# Patient Record
Sex: Female | Born: 1965 | Race: Black or African American | Hispanic: No | Marital: Married | Smoking: Never smoker
Health system: Southern US, Community
[De-identification: ages and names within clinical notes are randomized; demographics above are authoritative.]

## PROBLEM LIST (undated history)

## (undated) ENCOUNTER — Emergency Department (HOSPITAL_COMMUNITY): Payer: Self-pay

## (undated) DIAGNOSIS — N809 Endometriosis, unspecified: Secondary | ICD-10-CM

## (undated) DIAGNOSIS — N83209 Unspecified ovarian cyst, unspecified side: Secondary | ICD-10-CM

## (undated) HISTORY — PX: NO PAST SURGERIES: SHX2092

---

## 1998-02-16 ENCOUNTER — Other Ambulatory Visit: Admission: RE | Admit: 1998-02-16 | Discharge: 1998-02-16 | Payer: Self-pay | Admitting: Obstetrics and Gynecology

## 1998-05-22 ENCOUNTER — Encounter: Payer: Self-pay | Admitting: Emergency Medicine

## 1998-05-22 ENCOUNTER — Emergency Department (HOSPITAL_COMMUNITY): Admission: EM | Admit: 1998-05-22 | Discharge: 1998-05-22 | Payer: Self-pay | Admitting: Emergency Medicine

## 1999-01-03 ENCOUNTER — Encounter: Payer: Self-pay | Admitting: Emergency Medicine

## 1999-01-03 ENCOUNTER — Emergency Department (HOSPITAL_COMMUNITY): Admission: EM | Admit: 1999-01-03 | Discharge: 1999-01-03 | Payer: Self-pay | Admitting: Emergency Medicine

## 1999-01-10 ENCOUNTER — Encounter: Admission: RE | Admit: 1999-01-10 | Discharge: 1999-02-14 | Payer: Self-pay

## 2000-11-10 ENCOUNTER — Encounter: Payer: Self-pay | Admitting: Emergency Medicine

## 2000-11-10 ENCOUNTER — Emergency Department (HOSPITAL_COMMUNITY): Admission: EM | Admit: 2000-11-10 | Discharge: 2000-11-10 | Payer: Self-pay | Admitting: Emergency Medicine

## 2001-05-05 ENCOUNTER — Encounter: Payer: Self-pay | Admitting: Emergency Medicine

## 2001-05-05 ENCOUNTER — Inpatient Hospital Stay (HOSPITAL_COMMUNITY): Admission: AC | Admit: 2001-05-05 | Discharge: 2001-05-11 | Payer: Self-pay

## 2001-05-06 ENCOUNTER — Encounter: Payer: Self-pay | Admitting: Surgery

## 2001-05-07 ENCOUNTER — Encounter: Payer: Self-pay | Admitting: General Surgery

## 2001-05-08 ENCOUNTER — Encounter: Payer: Self-pay | Admitting: General Surgery

## 2001-07-14 ENCOUNTER — Encounter: Payer: Self-pay | Admitting: General Surgery

## 2001-07-14 ENCOUNTER — Ambulatory Visit (HOSPITAL_COMMUNITY): Admission: RE | Admit: 2001-07-14 | Discharge: 2001-07-14 | Payer: Self-pay | Admitting: General Surgery

## 2002-01-17 ENCOUNTER — Emergency Department (HOSPITAL_COMMUNITY): Admission: EM | Admit: 2002-01-17 | Discharge: 2002-01-17 | Payer: Self-pay | Admitting: *Deleted

## 2002-04-18 ENCOUNTER — Emergency Department (HOSPITAL_COMMUNITY): Admission: EM | Admit: 2002-04-18 | Discharge: 2002-04-18 | Payer: Self-pay | Admitting: Emergency Medicine

## 2002-04-30 ENCOUNTER — Emergency Department (HOSPITAL_COMMUNITY): Admission: EM | Admit: 2002-04-30 | Discharge: 2002-04-30 | Payer: Self-pay
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
## 2003-04-19 ENCOUNTER — Other Ambulatory Visit: Admission: RE | Admit: 2003-04-19 | Discharge: 2003-04-19 | Payer: Self-pay | Admitting: Family Medicine

## 2004-09-11 ENCOUNTER — Emergency Department (HOSPITAL_COMMUNITY): Admission: EM | Admit: 2004-09-11 | Discharge: 2004-09-11 | Payer: Self-pay | Admitting: Family Medicine

## 2005-08-21 ENCOUNTER — Emergency Department (HOSPITAL_COMMUNITY): Admission: EM | Admit: 2005-08-21 | Discharge: 2005-08-21 | Payer: Self-pay | Admitting: Family Medicine

## 2005-09-24 ENCOUNTER — Other Ambulatory Visit: Admission: RE | Admit: 2005-09-24 | Discharge: 2005-09-24 | Payer: Self-pay | Admitting: Obstetrics and Gynecology

## 2006-10-21 ENCOUNTER — Emergency Department (HOSPITAL_COMMUNITY): Admission: EM | Admit: 2006-10-21 | Discharge: 2006-10-21 | Payer: Self-pay | Admitting: Family Medicine

## 2007-07-06 ENCOUNTER — Emergency Department (HOSPITAL_COMMUNITY): Admission: EM | Admit: 2007-07-06 | Discharge: 2007-07-06 | Payer: Self-pay | Admitting: Emergency Medicine

## 2007-08-04 ENCOUNTER — Emergency Department (HOSPITAL_COMMUNITY): Admission: EM | Admit: 2007-08-04 | Discharge: 2007-08-04 | Payer: Self-pay | Admitting: Emergency Medicine

## 2007-10-16 ENCOUNTER — Emergency Department (HOSPITAL_COMMUNITY): Admission: EM | Admit: 2007-10-16 | Discharge: 2007-10-16 | Payer: Self-pay | Admitting: Family Medicine

## 2007-12-09 ENCOUNTER — Emergency Department (HOSPITAL_COMMUNITY): Admission: EM | Admit: 2007-12-09 | Discharge: 2007-12-09 | Payer: Self-pay | Admitting: Emergency Medicine

## 2010-02-14 ENCOUNTER — Other Ambulatory Visit: Admission: RE | Admit: 2010-02-14 | Discharge: 2010-02-14 | Payer: Self-pay | Admitting: Obstetrics and Gynecology

## 2010-02-14 ENCOUNTER — Ambulatory Visit: Payer: Self-pay | Admitting: Obstetrics and Gynecology

## 2010-03-01 ENCOUNTER — Ambulatory Visit: Payer: Self-pay | Admitting: Obstetrics and Gynecology

## 2010-07-24 ENCOUNTER — Ambulatory Visit: Payer: Self-pay | Admitting: Obstetrics and Gynecology

## 2010-08-02 ENCOUNTER — Emergency Department (HOSPITAL_COMMUNITY): Admission: EM | Admit: 2010-08-02 | Discharge: 2010-04-12 | Payer: Self-pay | Admitting: Emergency Medicine

## 2010-09-04 ENCOUNTER — Ambulatory Visit (HOSPITAL_COMMUNITY): Admission: RE | Admit: 2010-09-04 | Payer: Self-pay | Source: Home / Self Care | Admitting: Obstetrics and Gynecology

## 2010-09-05 ENCOUNTER — Ambulatory Visit: Admit: 2010-09-05 | Payer: Self-pay | Admitting: Obstetrics and Gynecology

## 2010-09-26 ENCOUNTER — Encounter: Payer: Self-pay | Admitting: Obstetrics and Gynecology

## 2010-10-08 ENCOUNTER — Other Ambulatory Visit: Payer: Self-pay | Admitting: Obstetrics and Gynecology

## 2010-10-08 DIAGNOSIS — R1900 Intra-abdominal and pelvic swelling, mass and lump, unspecified site: Secondary | ICD-10-CM

## 2010-10-15 ENCOUNTER — Institutional Professional Consult (permissible substitution): Payer: Self-pay | Admitting: Obstetrics and Gynecology

## 2010-10-16 ENCOUNTER — Ambulatory Visit (HOSPITAL_COMMUNITY)
Admission: RE | Admit: 2010-10-16 | Discharge: 2010-10-16 | Disposition: A | Discharge status: Home / Self Care | Payer: BC Managed Care – PPO | Source: Ambulatory Visit | Attending: Obstetrics and Gynecology | Admitting: Obstetrics and Gynecology

## 2010-10-16 ENCOUNTER — Encounter (HOSPITAL_COMMUNITY)
Admission: RE | Admit: 2010-10-16 | Discharge: 2010-10-16 | Disposition: A | Discharge status: Home / Self Care | Payer: BC Managed Care – PPO | Source: Ambulatory Visit | Attending: Obstetrics and Gynecology | Admitting: Obstetrics and Gynecology

## 2010-10-16 DIAGNOSIS — D252 Subserosal leiomyoma of uterus: Secondary | ICD-10-CM | POA: Insufficient documentation

## 2010-10-16 DIAGNOSIS — N9489 Other specified conditions associated with female genital organs and menstrual cycle: Secondary | ICD-10-CM | POA: Insufficient documentation

## 2010-10-16 DIAGNOSIS — R1900 Intra-abdominal and pelvic swelling, mass and lump, unspecified site: Secondary | ICD-10-CM

## 2010-10-16 LAB — CBC
HCT: 40.3 % (ref 36.0–46.0)
Hemoglobin: 13.4 g/dL (ref 12.0–15.0)
MCV: 90 fL (ref 78.0–100.0)
WBC: 5.6 10*3/uL (ref 4.0–10.5)

## 2010-10-17 ENCOUNTER — Institutional Professional Consult (permissible substitution) (INDEPENDENT_AMBULATORY_CARE_PROVIDER_SITE_OTHER): Payer: BC Managed Care – PPO | Admitting: Obstetrics and Gynecology

## 2010-10-17 DIAGNOSIS — N949 Unspecified condition associated with female genital organs and menstrual cycle: Secondary | ICD-10-CM

## 2010-10-23 ENCOUNTER — Ambulatory Visit (HOSPITAL_COMMUNITY)
Admission: RE | Admit: 2010-10-23 | Payer: BC Managed Care – PPO | Source: Ambulatory Visit | Admitting: Obstetrics and Gynecology

## 2010-12-18 ENCOUNTER — Institutional Professional Consult (permissible substitution): Payer: BC Managed Care – PPO | Admitting: Obstetrics and Gynecology

## 2010-12-24 ENCOUNTER — Institutional Professional Consult (permissible substitution) (INDEPENDENT_AMBULATORY_CARE_PROVIDER_SITE_OTHER): Payer: BC Managed Care – PPO | Admitting: Obstetrics and Gynecology

## 2010-12-24 DIAGNOSIS — N63 Unspecified lump in unspecified breast: Secondary | ICD-10-CM

## 2010-12-24 DIAGNOSIS — E7800 Pure hypercholesterolemia, unspecified: Secondary | ICD-10-CM
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
## 2011-05-17 LAB — POCT URINALYSIS DIP (DEVICE): Nitrite: NEGATIVE

## 2011-05-29 ENCOUNTER — Other Ambulatory Visit: Payer: Self-pay | Admitting: *Deleted

## 2011-05-29 ENCOUNTER — Other Ambulatory Visit: Payer: Self-pay | Admitting: Obstetrics and Gynecology

## 2011-05-29 DIAGNOSIS — N63 Unspecified lump in unspecified breast: Secondary | ICD-10-CM

## 2011-06-24 ENCOUNTER — Telehealth: Payer: Self-pay | Admitting: *Deleted

## 2011-06-24 NOTE — Telephone Encounter (Signed)
Patient wants [place] to ask you two things... 1. Would you be willing to see her 45 year old niece for gyn care (New patient) 2.  Could you recommend a good pediatrician

## 2011-06-24 NOTE — Telephone Encounter (Signed)
Tell patient Sorry but I do not see new patient's. Given her Dr. Maryellen Pile  name as a pediatrician.

## 2011-06-24 NOTE — Telephone Encounter (Signed)
Patient informed the information below.  Patient requested an appointment.  Transferred to appointments.

## 2011-11-18 ENCOUNTER — Other Ambulatory Visit: Payer: Self-pay

## 2011-11-18 DIAGNOSIS — N6019 Diffuse cystic mastopathy of unspecified breast: Secondary | ICD-10-CM

## 2012-06-26 ENCOUNTER — Emergency Department (INDEPENDENT_AMBULATORY_CARE_PROVIDER_SITE_OTHER)
Admission: EM | Admit: 2012-06-26 | Discharge: 2012-06-26 | Disposition: A | Discharge status: Home / Self Care | Payer: Self-pay | Source: Home / Self Care

## 2012-06-26 ENCOUNTER — Encounter (HOSPITAL_COMMUNITY): Payer: Self-pay | Admitting: Emergency Medicine

## 2012-06-26 DIAGNOSIS — A084 Viral intestinal infection, unspecified: Secondary | ICD-10-CM

## 2012-06-26 DIAGNOSIS — H8309 Labyrinthitis, unspecified ear: Secondary | ICD-10-CM

## 2012-06-26 DIAGNOSIS — J029 Acute pharyngitis, unspecified: Secondary | ICD-10-CM

## 2012-06-26 DIAGNOSIS — B9789 Other viral agents as the cause of diseases classified elsewhere: Secondary | ICD-10-CM

## 2012-06-26 DIAGNOSIS — B349 Viral infection, unspecified: Secondary | ICD-10-CM

## 2012-06-26 DIAGNOSIS — A088 Other specified intestinal infections: Secondary | ICD-10-CM

## 2012-06-26 DIAGNOSIS — H68003 Unspecified Eustachian salpingitis, bilateral: Secondary | ICD-10-CM

## 2012-06-26 DIAGNOSIS — H68009 Unspecified Eustachian salpingitis, unspecified ear: Secondary | ICD-10-CM

## 2012-06-26 MED ORDER — AMOXICILLIN 500 MG PO CAPS: 500.0000 mg | ORAL_CAPSULE | Freq: Three times a day (TID) | ORAL | Status: DC

## 2012-06-26 MED ORDER — HYDROXYZINE HCL 50 MG PO TABS: ORAL_TABLET | ORAL | Status: DC

## 2012-06-26 NOTE — ED Notes (Signed)
Pt c/o cold sx x5 days... Sx include: right ear pain, sore throat, fever, vomiting, nausea, diarrhea, cough w/yellow sputum, abd pain... Taking OTC cold meds... Pt is alert w/no signs of distress.

## 2012-06-26 NOTE — ED Provider Notes (Signed)
Medical screening examination/treatment/procedure(s) were performed by non-physician practitioner and as supervising physician I was immediately available for consultation/collaboration.  Diamonique Ruedas   Aldred Mase, MD 06/26/12 1403 

## 2012-06-26 NOTE — ED Provider Notes (Signed)
History     CSN: [id number]  Arrival date & time 06/26/12  1104   None     Chief Complaint  Patient presents with  . URI    (Consider location/radiation/quality/duration/timing/severity/associated sxs/prior treatment) HPI Comments: 46 year old female, who presents with "flulike symptoms". She complains of sore throat, earaches, abdominal cramping, and weakness. Yesterday, last night. She vomited a total of 4 times. No vomiting. Today. Yesterday. She had trace diarrhea, but none today. She denies myalgias or urinary symptoms.   History reviewed. No pertinent past medical history.  History reviewed. No pertinent past surgical history.  No family history on file.  History  Substance Use Topics  . Smoking status: Never Smoker   . Smokeless tobacco: Not on file  . Alcohol Use: Yes    OB History    Grav Para Term Preterm Abortions TAB SAB Ect Mult Living                  Review of Systems  Constitutional: Negative for fever, chills, activity change, appetite change and fatigue.  HENT: Positive for congestion, sore throat, rhinorrhea and postnasal drip. Negative for facial swelling, neck pain and neck stiffness.   Eyes: Negative.   Respiratory: Positive for cough.   Cardiovascular: Negative.   Gastrointestinal: Positive for nausea, vomiting, abdominal pain and diarrhea. Negative for constipation.  Genitourinary: Negative.   Skin: Negative for pallor and rash.  Neurological: Negative.     Allergies  Pear  Home Medications   Current Outpatient Rx  Name Route Sig Dispense Refill  . AMOXICILLIN 500 MG PO CAPS Oral Take 1 capsule (500 mg total) by mouth 3 (three) times daily. 21 capsule 0  . AMOXICILLIN 500 MG PO CAPS Oral Take 1 capsule (500 mg total) by mouth 3 (three) times daily. 21 capsule 0  . HYDROXYZINE HCL 50 MG PO TABS  One or two q hs prn sleeping 6 tablet 0    BP 148/85  Pulse 60  Temp 98.4 F (36.9 C) (Oral)  Resp 18  SpO2 100%  Physical Exam    Nursing note and vitals reviewed. Constitutional: She is oriented to person, place, and time. She appears well-developed and well-nourished. No distress.  HENT:  Head: Normocephalic.  Right Ear: External ear normal.  Left Ear: External ear normal.  Mouth/Throat: No oropharyngeal exudate.       OP with minor erythema, but no swelling or exudates.  Eyes: Conjunctivae normal and EOM are normal. Pupils are equal, round, and reactive to light.  Neck: Normal range of motion. Neck supple.  Cardiovascular: Normal rate, regular rhythm and normal heart sounds.   Pulmonary/Chest: Effort normal and breath sounds normal. No respiratory distress. She has no wheezes.  Abdominal: Soft. She exhibits no distension and no mass. There is no rebound and no guarding.       Minor tenderness to the left lower quadrant.  Musculoskeletal: Normal range of motion.  Lymphadenopathy:    She has no cervical adenopathy.  Neurological: She is alert and oriented to person, place, and time. No cranial nerve deficit.  Skin: Skin is warm and dry.  Psychiatric: She has a normal mood and affect.    ED Course  Procedures (including critical care time)   Labs Reviewed  POCT RAPID STREP A ([facility] ONLY)   No results found.   1. Viral syndrome   2. Viral labyrinthitis syndrome   3. Pharyngitis   4. Viral gastroenteritis   5. Salpingitis of both Eustachian tubes  MDM   Results for orders placed during the hospital encounter of 06/26/12  POCT RAPID STREP A ([facility] ONLY)      Component Value Range   Streptococcus, Group A Screen (Direct) NEGATIVE  NEGATIVE   I explained to the patient that she has a viral type syndrome and did not find any evidence of a bacterial infection. First strep throat swab was negative. Explained to her about eustachian tube dysfunction and ear pain, as well as the reason for her sore throat. She, states she has been taking NyQuil and OTC medications, but is not sure of  her symptoms. I tried to explain to her about viruses or symptoms and running a course. She seemed to either refused to understand or unable to understand. She insists on having an antibiotic and and becoming mildly argumentative. I prescribed amoxicillin 3 times a day for 7 days . She wanted something to help her sleep. I gave her 6 capsules of hydroxyzine 50 mg to take each bedtime when necessary Drink clear any of fluids and stay well hydrated I strongly recommended that she read the instructions on antibiotic use for viruses.        Hayden Rasmussen, NP 06/26/12 1402

## 2012-10-14 ENCOUNTER — Encounter (HOSPITAL_COMMUNITY): Payer: Self-pay | Admitting: *Deleted

## 2012-10-14 ENCOUNTER — Emergency Department (HOSPITAL_COMMUNITY): Payer: Self-pay

## 2012-10-14 ENCOUNTER — Emergency Department (HOSPITAL_COMMUNITY)
Admission: EM | Admit: 2012-10-14 | Discharge: 2012-10-14 | Disposition: A | Discharge status: Home / Self Care | Payer: Self-pay | Attending: Emergency Medicine | Admitting: Emergency Medicine

## 2012-10-14 DIAGNOSIS — J029 Acute pharyngitis, unspecified: Secondary | ICD-10-CM | POA: Insufficient documentation

## 2012-10-14 DIAGNOSIS — J40 Bronchitis, not specified as acute or chronic: Secondary | ICD-10-CM | POA: Insufficient documentation

## 2012-10-14 DIAGNOSIS — J069 Acute upper respiratory infection, unspecified: Secondary | ICD-10-CM | POA: Insufficient documentation

## 2012-10-14 LAB — RAPID STREP SCREEN (MED CTR MEBANE ONLY): Streptococcus, Group A Screen (Direct): NEGATIVE

## 2012-10-14 MED ORDER — PROMETHAZINE-DM 6.25-15 MG/5ML PO SYRP: 5.0000 mL | ORAL_SOLUTION | Freq: Four times a day (QID) | ORAL | Status: DC | PRN

## 2012-10-14 MED ORDER — PSEUDOEPHEDRINE HCL 60 MG PO TABS: ORAL_TABLET | ORAL | Status: DC

## 2012-10-14 MED ORDER — PREDNISONE 10 MG PO TABS: 20.0000 mg | ORAL_TABLET | Freq: Every day | ORAL | Status: DC

## 2012-10-14 NOTE — ED Provider Notes (Signed)
History  This chart was scribed for non-physician practitioner working with Flint Melter, MD by Ardeen Jourdain, ED Scribe. This patient was seen in room TR06C/TR06C and the patient's care was started at 1950.  CSN: [id number]  Arrival date & time 10/14/12  1746   None     Chief Complaint  Patient presents with  . Cough  . Sore Throat     Patient is a 47 y.o. female presenting with cough and pharyngitis. The history is provided by the patient. No language interpreter was used.  Cough Cough characteristics:  Productive Sputum characteristics:  Yellow Severity:  Moderate Onset quality:  Gradual Duration:  8 weeks Timing:  Intermittent Progression:  Unchanged Chronicity:  New Smoker: no   Context: sick contacts   Relieved by:  Nothing Worsened by:  Deep breathing Ineffective treatments:  Decongestant, fluids and cough suppressants Associated symptoms: sore throat   Associated symptoms: no chest pain, no chills, no diaphoresis, no fever, no headaches, no rhinorrhea, no shortness of breath and no sinus congestion   Sore throat:    Severity:  Moderate   Onset quality:  Gradual   Timing:  Constant   Progression:  Unchanged Sore Throat This is a new problem. The current episode started more than 1 week ago. The problem occurs constantly. The problem has not changed since onset.Pertinent negatives include no chest pain, no abdominal pain, no headaches and no shortness of breath. The symptoms are aggravated by swallowing. Nothing relieves the symptoms. She has tried rest for the symptoms. The treatment provided no relief.    Brenda Barton is a 47 y.o. female who presents to the Emergency Department complaining of productive cough with associated sore throat. She states she was seen at Urgent Care in November and was diagnosed with influenza. She states her symptoms have continued since then. She admits to sick contact. She states she had diarrhea a few weeks ago but it resolved  itself. She denies any fever, nausea or emesis currently.   History reviewed. No pertinent past medical history.  History reviewed. No pertinent past surgical history.  History reviewed. No pertinent family history.  History  Substance Use Topics  . Smoking status: Never Smoker   . Smokeless tobacco: Not on file  . Alcohol Use: Yes   No OB history available.   Review of Systems  Constitutional: Negative for fever, chills and diaphoresis.  HENT: Positive for sore throat. Negative for rhinorrhea.   Eyes: Positive for pain.  Respiratory: Positive for cough. Negative for shortness of breath.   Cardiovascular: Negative for chest pain.  Gastrointestinal: Negative for nausea, vomiting, abdominal pain and diarrhea.  Neurological: Negative for headaches.  All other systems reviewed and are negative.    Allergies  Pear  Home Medications   Current Outpatient Rx  Name  Route  Sig  Dispense  Refill  . Phenyleph-CPM-DM-APAP (ALKA-SELTZER PLUS COLD & FLU PO)   Oral   Take 2 tablets by mouth every 6 (six) hours as needed (for cold symptoms).         . Pseudoeph-Doxylamine-DM-APAP (NYQUIL PO)   Oral   Take 2 capsules by mouth every 4 (four) hours as needed (for cold/flu symptoms).           Triage Vitals: BP 142/92  Pulse 67  Temp(Src) 98.6 F (37 C) (Oral)  Resp 18  SpO2 98%  Physical Exam  Nursing note and vitals reviewed. Constitutional: She is oriented to person, place, and time. She appears well-developed  and well-nourished. No distress.  HENT:  Head: Normocephalic and atraumatic.  Right Ear: External ear normal.  Left Ear: External ear normal.  Nose: Nose normal.  Mouth/Throat: Oropharynx is clear and moist. No oropharyngeal exudate.  TMs normal bilaterally, mild swelling of the uvula, nasal congestion present,   Eyes: Conjunctivae and EOM are normal. Pupils are equal, round, and reactive to light.  Neck: Normal range of motion. Neck supple. No tracheal  deviation present.  Cardiovascular: Normal rate, regular rhythm and normal heart sounds.  Exam reveals no gallop and no friction rub.   No murmur heard. Pulmonary/Chest: Effort normal. No respiratory distress. She has no wheezes. She has no rales. She exhibits no tenderness.  Coarse breath sounds  Abdominal: Soft. She exhibits no distension.  Musculoskeletal: Normal range of motion. She exhibits no edema and no tenderness.  Neurological: She is alert and oriented to person, place, and time.  Skin: Skin is warm and dry. She is not diaphoretic.  Psychiatric: She has a normal mood and affect. Her behavior is normal.    ED Course  Procedures (including critical care time)  DIAGNOSTIC STUDIES: Oxygen Saturation is 98% on room air, normal by my interpretation.    COORDINATION OF CARE:  8:24 PM: Discussed treatment plan which includes rapid strep screen and a CXR with pt at bedside and pt agreed to plan.   Results for orders placed during the hospital encounter of 10/14/12  RAPID STREP SCREEN      Result Value Range   Streptococcus, Group A Screen (Direct) NEGATIVE  NEGATIVE   Dg Chest 2 View  10/14/2012  *RADIOLOGY REPORT*  Clinical Data: Shortness of breath.  CHEST - 2 VIEW  Comparison: 04/12/2010.  Findings: The cardiac silhouette, mediastinal and hilar contours are normal and stable.  The lungs are clear of infiltrates, edema or effusions.  There is mild peribronchial thickening and slight increased interstitial markings which could suggest bronchitis. The bony thorax is intact.  IMPRESSION: Bronchitic changes but no focal infiltrates.   Original Report Authenticated By: Rudie Meyer, M.D.       No diagnosis found.    MDM  I have reviewed nursing notes, vital signs, and all appropriate lab and imaging results for this patient.  Patient reports cold symptoms for nearly 3 months. She has been trying over-the-counter medications without success. The patient states she has been  exposed to viral illnesses 2 children in the family, as well as patients at her job. The patient presents to the emergency department today because of soreness in her chest and feeling at times as though it's difficult to breathe. The chest x-ray shows bronchitic changes but no pneumonia. The pulse oximetry is 98% on room air. The patient is able to speak in complete sentences without problem. Plan the patient will be treated with prednisone for 6 days, Sudafed 3 times daily for congestion, patient advised to increase fluids, and to use promethazine DM cough medication for cough and congestion. Patient is to see her primary physician or return to the emergency department if not improving.      Kathie Dike, [place] 10/14/12 2035

## 2012-10-14 NOTE — ED Notes (Signed)
Pt reports having cold and URI symptoms x 2 months. Having sore throat, productive cough with yellow sputum. Denies any fevers or n/v/d. Airway intact.

## 2012-10-14 NOTE — Discharge Instructions (Signed)
Bronchitis Bronchitis is a problem of the air tubes leading to your lungs. This problem makes it hard for air to get in and out of the lungs. You may cough a lot because your air tubes are narrow. Going without care can cause lasting (chronic) bronchitis. HOME CARE   Drink enough fluids to keep your pee (urine) clear or pale yellow.  Use a cool mist humidifier.  Quit smoking if you smoke. If you keep smoking, the bronchitis might not get better.  Only take medicine as told by your doctor. GET HELP RIGHT AWAY IF:   Coughing keeps you awake.  You start to wheeze.  You become more sick or weak.  You have a hard time breathing or get short of breath.  You cough up blood.  Coughing lasts more than 2 weeks.  You have a fever.  Your baby is older than 3 months with a rectal temperature of 102 F (38.9 C) or higher.  Your baby is 62 months old or younger with a rectal temperature of 100.4 F (38 C) or higher. MAKE SURE YOU:  Understand these instructions.  Will watch your condition.  Will get help right away if you are not doing well or get worse. Document Released: 01/29/2008 Document Revised: 11/04/2011 Document Reviewed: 07/14/2009 [facility] Patient Information 2013 [place], [place].  Upper Respiratory Infection, Adult An upper respiratory infection (URI) is also known as the common cold. It is often caused by a type of germ (virus). Colds are easily spread (contagious). You can pass it to others by kissing, coughing, sneezing, or drinking out of the same glass. Usually, you get better in 1 or 2 weeks.  HOME CARE   Only take medicine as told by your doctor.  Use a warm mist humidifier or breathe in steam from a hot shower.  Drink enough water and fluids to keep your pee (urine) clear or pale yellow.  Get plenty of rest.  Return to work when your temperature is back to normal or as told by your doctor. You may use a face mask and wash your hands to stop your cold from  spreading. GET HELP RIGHT AWAY IF:   After the first few days, you feel you are getting worse.  You have questions about your medicine.  You have chills, shortness of breath, or brown or red spit (mucus).  You have yellow or brown snot (nasal discharge) or pain in the face, especially when you bend forward.  You have a fever, puffy (swollen) neck, pain when you swallow, or white spots in the back of your throat.  You have a bad headache, ear pain, sinus pain, or chest pain.  You have a high-pitched whistling sound when you breathe in and out (wheezing).  You have a lasting cough or cough up blood.  You have sore muscles or a stiff neck. MAKE SURE YOU:   Understand these instructions.  Will watch your condition.  Will get help right away if you are not doing well or get worse. Document Released: 01/29/2008 Document Revised: 11/04/2011 Document Reviewed: 12/17/2010 [facility] Patient Information 2013 [place], [place].

## 2012-10-15 NOTE — ED Provider Notes (Signed)
Medical screening examination/treatment/procedure(s) were performed by non-physician practitioner and as supervising physician I was immediately available for consultation/collaboration.   Amila Callies L Amayrany Cafaro, MD 10/15/12 0024 

## 2013-01-21 ENCOUNTER — Inpatient Hospital Stay (HOSPITAL_COMMUNITY)
Admission: AD | Admit: 2013-01-21 | Discharge: 2013-01-22 | Disposition: A | Discharge status: Home / Self Care | Payer: Self-pay | Source: Ambulatory Visit | Attending: Gynecology | Admitting: Gynecology

## 2013-01-21 DIAGNOSIS — R1032 Left lower quadrant pain: Secondary | ICD-10-CM | POA: Insufficient documentation

## 2013-01-21 DIAGNOSIS — N912 Amenorrhea, unspecified: Secondary | ICD-10-CM | POA: Insufficient documentation

## 2013-01-21 DIAGNOSIS — D259 Leiomyoma of uterus, unspecified: Secondary | ICD-10-CM | POA: Insufficient documentation

## 2013-01-21 DIAGNOSIS — K5900 Constipation, unspecified: Secondary | ICD-10-CM

## 2013-01-21 HISTORY — DX: Endometriosis, unspecified: N80.9

## 2013-01-21 HISTORY — DX: Unspecified ovarian cyst, unspecified side: N83.209

## 2013-01-21 NOTE — MAU Note (Signed)
Pt reports LLQ pain x 3 days.  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

## 2013-01-22 ENCOUNTER — Encounter (HOSPITAL_COMMUNITY): Payer: Self-pay | Admitting: *Deleted

## 2013-01-22 ENCOUNTER — Inpatient Hospital Stay (HOSPITAL_COMMUNITY): Payer: Self-pay

## 2013-01-22 DIAGNOSIS — D259 Leiomyoma of uterus, unspecified: Secondary | ICD-10-CM

## 2013-01-22 LAB — WET PREP, GENITAL
Trich, Wet Prep: NONE SEEN
WBC, Wet Prep HPF POC: NONE SEEN

## 2013-01-22 LAB — URINALYSIS, ROUTINE W REFLEX MICROSCOPIC
Glucose, UA: NEGATIVE mg/dL
Ketones, ur: NEGATIVE mg/dL
Leukocytes, UA: NEGATIVE
Protein, ur: NEGATIVE mg/dL
Specific Gravity, Urine: >1.03 — ABNORMAL HIGH (ref 1.005–1.030)
Urobilinogen, UA: 0.2 mg/dL (ref 0.0–1.0)

## 2013-01-22 LAB — POCT PREGNANCY, URINE: Preg Test, Ur: NEGATIVE

## 2013-01-22 LAB — URINE MICROSCOPIC-ADD ON

## 2013-01-22 MED ORDER — HYDROCODONE-ACETAMINOPHEN 7.5-300 MG PO TABS: 1.0000 | ORAL_TABLET | Freq: Four times a day (QID) | ORAL | Status: DC | PRN

## 2013-01-22 MED ORDER — POLYETHYLENE GLYCOL 3350 17 G PO PACK: 17.0000 g | PACK | Freq: Every day | ORAL | Status: DC

## 2013-01-22 MED ORDER — ACETAMINOPHEN 325 MG PO TABS
650.0000 mg | ORAL_TABLET | Freq: Once | ORAL | Status: AC
  Administered 2013-01-22: 650 mg via ORAL
  Filled 2013-01-22: qty 2

## 2013-01-22 NOTE — MAU Provider Note (Signed)
History     CSN: [id number]  Arrival date and time: 01/21/13 2057   None     Chief Complaint  Patient presents with  . Abdominal Pain   HPI This is a 47 y.o. female who presents with c/o LLQ pain for 3 days with "swelling".  Does admit to having some constipation.  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 States ovulated last week. Dr Oletha Blend has seen her in the past, but she has no insurance and has not seen him in years. States will go back to him when she gets Obamacare.  States has to take vicodin at times for pain in this area.   Several times during visit, she had questions about and ruminated about not being able to have kids. States she wants to harvest her eggs and use a surrogate. States "no one in [place] will do it, but there is one doctor in [place] who will".  Wants to know if it will work.   RN Note: Pt reports LLQ pain x 3 days. Pt does not have periods due to irregular developed uterus  OB History   Grav Para Term Preterm Abortions TAB SAB Ect Mult Living                  Past Medical History  Diagnosis Date  . Endometriosis   . Ovarian cyst     Past Surgical History  Procedure Laterality Date  . No past surgeries      No family history on file.  History  Substance Use Topics  . Smoking status: Never Smoker   . Smokeless tobacco: Not on file  . Alcohol Use: Yes     Comment: occasional    Allergies:  Allergies  Allergen Reactions  . Pear Other (See Comments)    Causes skin to peel.    Prescriptions prior to admission  Medication Sig Dispense Refill  . Acetaminophen-Caff-Pyrilamine (MIDOL COMPLETE) 500-60-15 MG TABS Take 3 tablets by mouth 3 (three) times daily as needed.      . naproxen sodium (ALEVE) 220 MG tablet Take 880 mg by mouth 3 (three) times daily as needed (For pain.).      Marland Kitchen Pseudoeph-Doxylamine-DM-APAP (NYQUIL PO) Take 30 mLs by mouth at bedtime as needed (For sleep.).        Review of Systems  Constitutional:  Negative for fever, chills and malaise/fatigue.  Gastrointestinal: Positive for abdominal pain and constipation. Negative for nausea, vomiting and diarrhea.  Genitourinary: Negative for dysuria.  Musculoskeletal: Negative for myalgias.  Neurological: Negative for dizziness, weakness and headaches.   Physical Exam   Blood pressure 128/81, pulse 62, temperature 98.3 F (36.8 C), temperature source Oral, resp. rate 16, height 5\' 4"  (1.626 m), weight 73.664 kg (162 lb 6.4 oz).  Physical Exam  Constitutional: She is oriented to person, place, and time. She appears well-developed and well-nourished. No distress.  HENT:  Head: Normocephalic.  Cardiovascular: Normal rate.   Respiratory: Effort normal.  GI: Soft. She exhibits no distension. There is tenderness (mild over LLQ). There is no rebound and no guarding.  Genitourinary: Vagina normal. No vaginal discharge found.  Cervix absent, Uterus difficult to feel. Fullness is felt in LLQ  Musculoskeletal: Normal range of motion.  Neurological: She is alert and oriented to person, place, and time.  Skin: Skin is warm and dry.  Psychiatric: She has a normal mood and affect.    MAU Course  Procedures  MDM Prior US from 2012  was reviewed. Large LLQ fibroid noted then, along with one other  US Transvaginal Non-ob  01/22/2013   *RADIOLOGY REPORT*  Clinical Data: Left lower quadrant abdominal pain.  Amenorrhea.  TRANSABDOMINAL AND TRANSVAGINAL ULTRASOUND OF PELVIS Technique:  Both transabdominal and transvaginal ultrasound examinations of the pelvis were performed. Transabdominal technique was performed for global imaging of the pelvis including uterus, ovaries, adnexal regions, and pelvic cul-de-sac.  It was necessary to proceed with endovaginal exam following the transabdominal exam to visualize the uterus and ovaries in greater detail.  Comparison:  None  Findings:  Uterus: Normal in size; measures 6.7 x 4.7 x 4.8 cm.  Three fibroids are seen.  The  first is noted obscuring the endometrial canal, at the level of the fundus, measuring 4.1 x 3.5 x 3.4 cm. There is a large exophytic fibroid measuring 8.5 x 7.0 x 6.8 cm arising from the left side of the uterine fundus, and a 3.0 x 2.5 cm fibroid is seen arising at the anterior aspect of the uterine fundus.  Endometrium: Not characterized.  Right ovary:  Normal appearance/no adnexal mass; measures 3.4 x 3.0 x 1.9 cm.  Left ovary: Normal appearance/no adnexal mass; measures 2.5 x 2.2 x 1.9 cm.  Other findings: A small amount of free fluid within the pelvic cul- de-sac is likely physiologic in nature.  IMPRESSION: Fibroid uterus noted, with the largest fibroid measuring 8.5 cm in size.  The endometrial echo complex is not characterized due to an underlying fibroid.  No additional abnormalities seen.  The ovaries appear grossly unremarkable, without evidence for ovarian torsion.   Original Report Authenticated By: Tonia Ghent, M.D.    Assessment and Plan  A:  LLQ pain for  3 days      Patient admits this may be related to constipation      Known stable large Left fibroid      Two other fibroids       Simple cyst right ovary  P;  Reassured fibroids are stable       Pain may be more related to constipation       Rx Miralax and small amt of VIcodin       Offered followup in clinic but pt wants to go back to [place] someday           [facility] 01/22/2013, 1:10 AM

## 2013-01-22 NOTE — MAU Provider Note (Signed)
Chart reviewed and agree with management and plan.  

## 2014-01-16 ENCOUNTER — Encounter (HOSPITAL_COMMUNITY): Payer: Self-pay | Admitting: Emergency Medicine

## 2014-01-16 ENCOUNTER — Emergency Department (HOSPITAL_COMMUNITY)
Admission: EM | Admit: 2014-01-16 | Discharge: 2014-01-16 | Disposition: A | Discharge status: Home / Self Care | Payer: Self-pay | Attending: Emergency Medicine | Admitting: Emergency Medicine

## 2014-01-16 ENCOUNTER — Emergency Department (HOSPITAL_COMMUNITY): Payer: Self-pay

## 2014-01-16 DIAGNOSIS — R071 Chest pain on breathing: Secondary | ICD-10-CM | POA: Insufficient documentation

## 2014-01-16 DIAGNOSIS — R0602 Shortness of breath: Secondary | ICD-10-CM | POA: Insufficient documentation

## 2014-01-16 DIAGNOSIS — Z8742 Personal history of other diseases of the female genital tract: Secondary | ICD-10-CM | POA: Insufficient documentation

## 2014-01-16 DIAGNOSIS — R0789 Other chest pain: Secondary | ICD-10-CM

## 2014-01-16 LAB — BASIC METABOLIC PANEL
BUN: 15 mg/dL (ref 6–23)
CO2: 26 meq/L (ref 19–32)
Calcium: 9.5 mg/dL (ref 8.4–10.5)
Chloride: 104 mEq/L (ref 96–112)
Creatinine, Ser: 0.58 mg/dL (ref 0.50–1.10)
GFR calc Af Amer: >90 mL/min (ref >90)
GLUCOSE: 84 mg/dL (ref 70–99)
POTASSIUM: 4.1 meq/L (ref 3.7–5.3)
SODIUM: 141 meq/L (ref 137–147)

## 2014-01-16 LAB — CBC
HEMATOCRIT: 39.8 % (ref 36.0–46.0)
Hemoglobin: 13.3 g/dL (ref 12.0–15.0)
MCH: 30.2 pg (ref 26.0–34.0)
MCHC: 33.4 g/dL (ref 30.0–36.0)
MCV: 90.2 fL (ref 78.0–100.0)
Platelets: 190 10*3/uL (ref 150–400)
RBC: 4.41 MIL/uL (ref 3.87–5.11)
RDW: 14.6 % (ref 11.5–15.5)
WBC: 7.8 10*3/uL (ref 4.0–10.5)

## 2014-01-16 LAB — I-STAT TROPONIN, ED
TROPONIN I, POC: 0 ng/mL (ref 0.00–0.08)
TROPONIN I, POC: 0 ng/mL (ref 0.00–0.08)

## 2014-01-16 MED ORDER — IBUPROFEN 800 MG PO TABS
800.0000 mg | ORAL_TABLET | Freq: Once | ORAL | Status: AC
  Administered 2014-01-16: 800 mg via ORAL
  Filled 2014-01-16: qty 1

## 2014-01-16 MED ORDER — IBUPROFEN 800 MG PO TABS: 800.0000 mg | ORAL_TABLET | Freq: Three times a day (TID) | ORAL | Status: DC | PRN

## 2014-01-16 NOTE — Discharge Instructions (Signed)

## 2014-01-16 NOTE — ED Provider Notes (Signed)
CSN: [id number]     Arrival date & time 01/16/14  1949 History   First MD Initiated Contact with Patient 01/16/14 1957     Chief Complaint  Patient presents with  . Chest Pain     (Consider location/radiation/quality/duration/timing/severity/associated sxs/prior Treatment) Patient is a 48 y.o. female presenting with chest pain. The history is provided by the patient. No language interpreter was used.  Chest Pain Pain location:  L chest Pain radiates to the back: no   Associated symptoms: shortness of breath   Associated symptoms: no abdominal pain, no cough, no fever, no headache, no nausea and not vomiting   Associated symptoms comment:  Sharp left sided chest pain that started just prior to arrival. She describes sharp shooting pain that lasts seconds and comes and goes. It lasted about 1 1/2 hours before resolving with aspirin. It causes her to be SOB "like its going to hurt more if I breathe." No cough, fever, or recent illness. No nausea or vomiting.    Past Medical History  Diagnosis Date  . Endometriosis   . Ovarian cyst    Past Surgical History  Procedure Laterality Date  . No past surgeries     No family history on file. History  Substance Use Topics  . Smoking status: Never Smoker   . Smokeless tobacco: Not on file  . Alcohol Use: Yes     Comment: occasional   OB History   Grav Para Term Preterm Abortions TAB SAB Ect Mult Living                 Review of Systems  Constitutional: Negative for fever.  Respiratory: Positive for shortness of breath. Negative for cough.   Cardiovascular: Positive for chest pain.  Gastrointestinal: Negative for nausea, vomiting and abdominal pain.  Musculoskeletal: Negative.   Neurological: Negative.  Negative for headaches.      Allergies  Pear  Home Medications   Prior to Admission medications   Not on File   BP 142/84  Pulse 65  Temp(Src) 98 F (36.7 C) (Oral)  Resp 18  Ht 5\' 5"  (1.651 m)  Wt 165 lb (74.844 kg)   BMI 27.46 kg/m2  SpO2 99% Physical Exam  Constitutional: She is oriented to person, place, and time. She appears well-developed and well-nourished.  HENT:  Head: Normocephalic.  Neck: Normal range of motion. Neck supple.  Cardiovascular: Normal rate and regular rhythm.   Pulmonary/Chest: Effort normal and breath sounds normal. She exhibits no tenderness.  Left breast is non-tender without mass. No nipple bleeding or discharge.   Abdominal: Soft. Bowel sounds are normal. There is no tenderness. There is no rebound and no guarding.  Musculoskeletal: Normal range of motion. She exhibits no edema.  Neurological: She is alert and oriented to person, place, and time. No cranial nerve deficit.  Skin: Skin is warm and dry. No rash noted.  Psychiatric: She has a normal mood and affect.    ED Course  Procedures (including critical care time) Labs Review Labs Reviewed  CBC  BASIC METABOLIC PANEL  I-STAT [place], ED  [facility], ED    Imaging Review Dg Chest Port 1 View  01/16/2014   CLINICAL DATA:  Left chest pain.  Short of breath.  EXAM: PORTABLE CHEST - 1 VIEW  COMPARISON:  10/14/2012  FINDINGS: The heart size and mediastinal contours are within normal limits. Both lungs are clear. The visualized skeletal structures are unremarkable.  IMPRESSION: No active disease.   Electronically Signed  By: Lajean Manes M.D.   On: 01/16/2014 20:27     EKG Interpretation None      MDM   Final diagnoses:  None    1. Nonspecific chest wall pain  Troponin, including delta troponin, negative. Repeat EKG in ED negative for acute changes. Symptoms atypical of cardiac chest pain. Doubt ACS. Stable for discharge.     Dewaine Oats, PA-C 01/16/14 2251

## 2014-01-16 NOTE — ED Provider Notes (Signed)
Medical screening examination/treatment/procedure(s) were performed by non-physician practitioner and as supervising physician I was immediately available for consultation/collaboration.   EKG Interpretation None       Date: 01/16/2014  Rate: 66  Rhythm: normal sinus rhythm  QRS Axis: normal  Intervals: normal  ST/T Wave abnormalities: normal  Conduction Disutrbances:none  Narrative Interpretation:   Old EKG Reviewed: unchanged   Date: 01/16/2014  Rate: 70  Rhythm: normal sinus rhythm  QRS Axis: normal  Intervals: normal  ST/T Wave abnormalities: normal  Conduction Disutrbances:none  Narrative Interpretation:   Old EKG Reviewed: unchanged      Malvin Johns, MD 01/16/14 2342

## 2014-01-16 NOTE — ED Notes (Signed)
Pt states she was at work two hours prior and started having a stabbing pain around her left nipple.  Pt took 2 81mg  ASA prior to arrival

## 2014-07-14 IMAGING — US US PELVIS COMPLETE
1 series · 13 of 25 positions shown · non-contrast
Comparison: None

CLINICAL DATA: Left lower quadrant abdominal pain.  Amenorrhea.

TRANSABDOMINAL AND TRANSVAGINAL ULTRASOUND OF PELVIS
TECHNIQUE: Both transabdominal and transvaginal ultrasound
examinations of the pelvis were performed. Transabdominal technique
was performed for global imaging of the pelvis including uterus,
ovaries, adnexal regions, and pelvic cul-de-sac.
It was necessary to proceed with endovaginal exam following the
transabdominal exam to visualize the uterus and ovaries in greater
detail.

[Series 1: us pelvis complete · 13 of 56 slices shown]
[im 1/56]
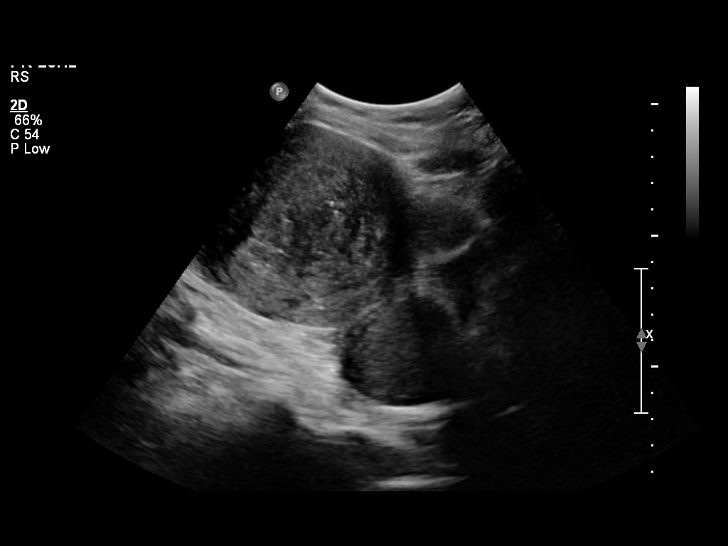
[im 5/56]
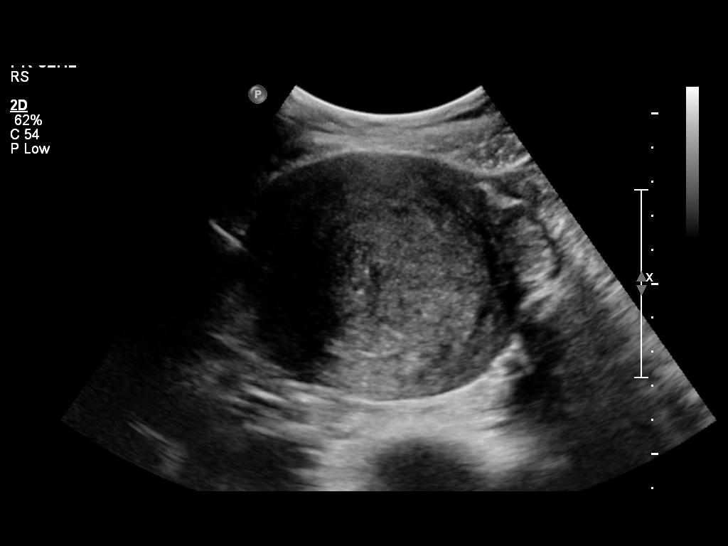
[im 10/56]
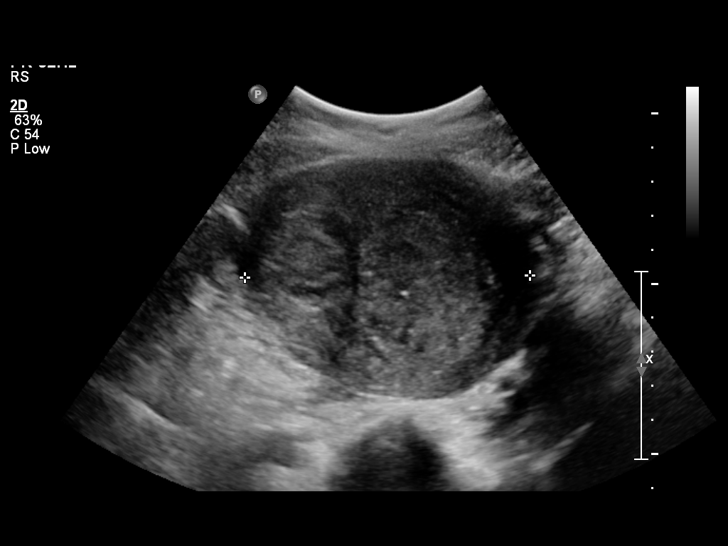
[im 14/56]
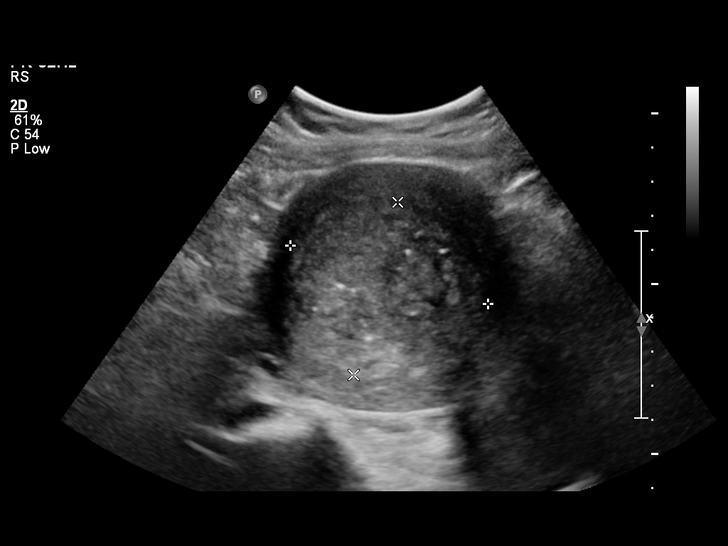
[im 19/56]
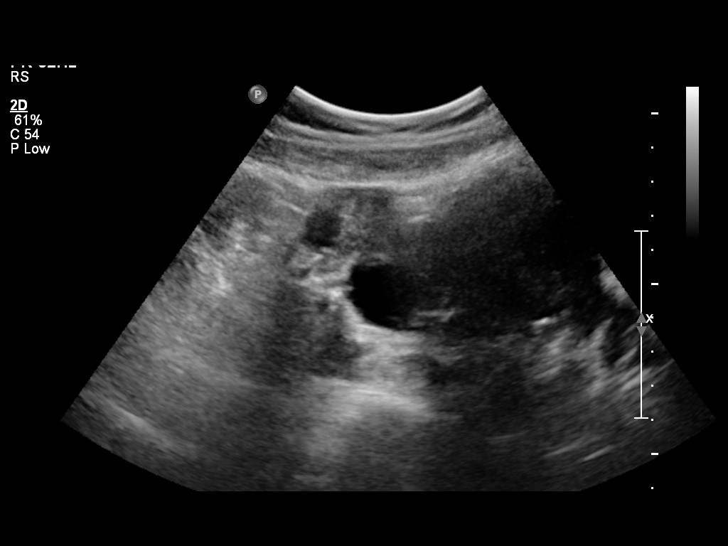
[im 23/56]
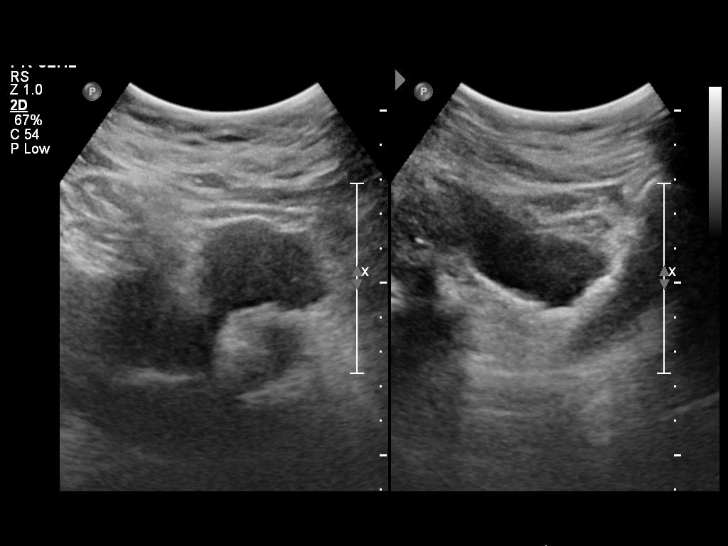
[im 28/56]
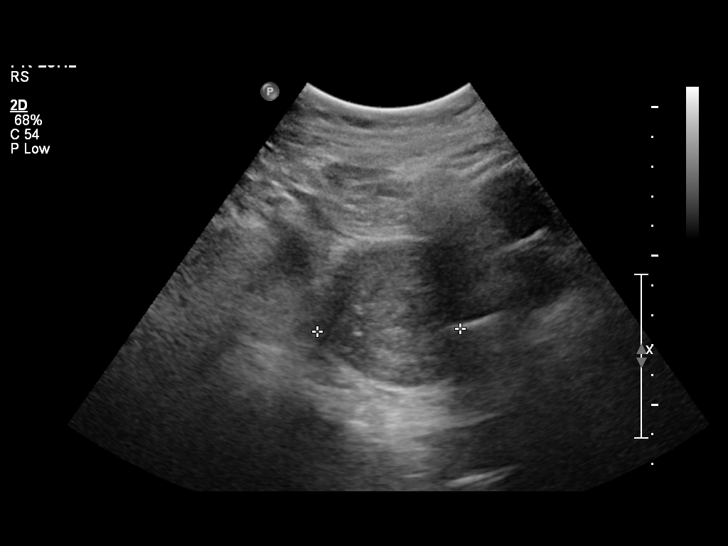
[im 33/56]
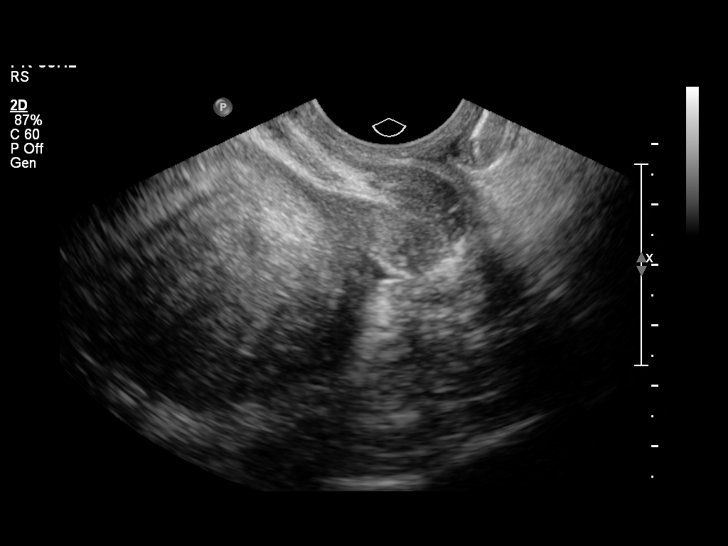
[im 37/56]
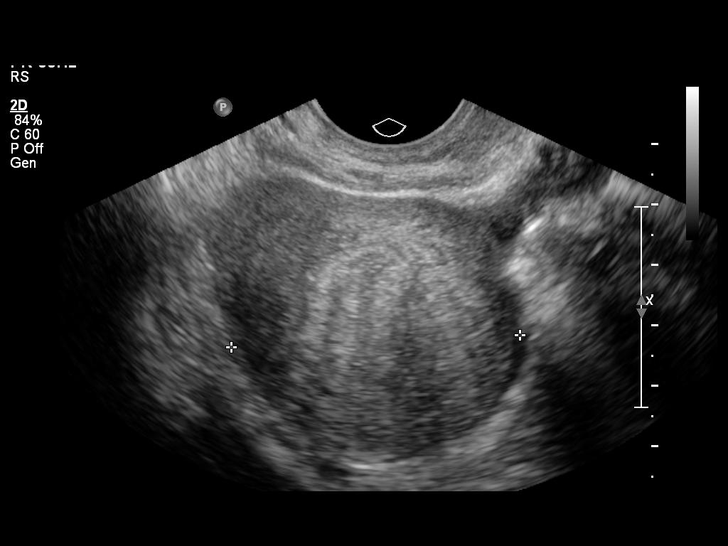
[im 42/56]
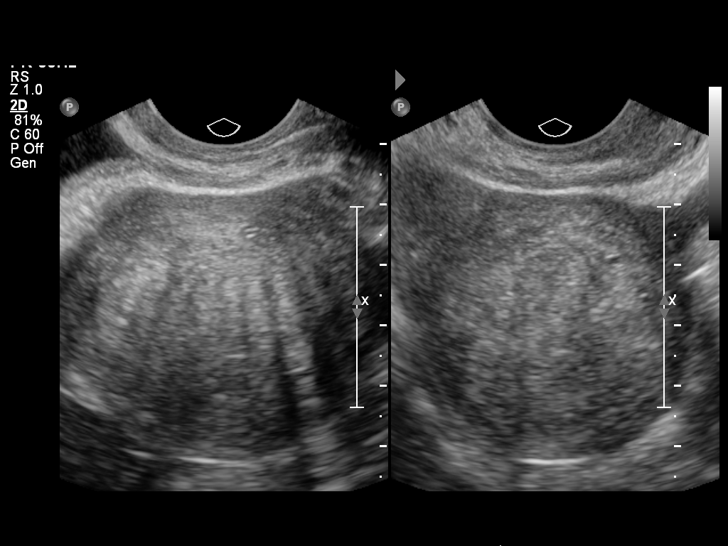
[im 46/56]
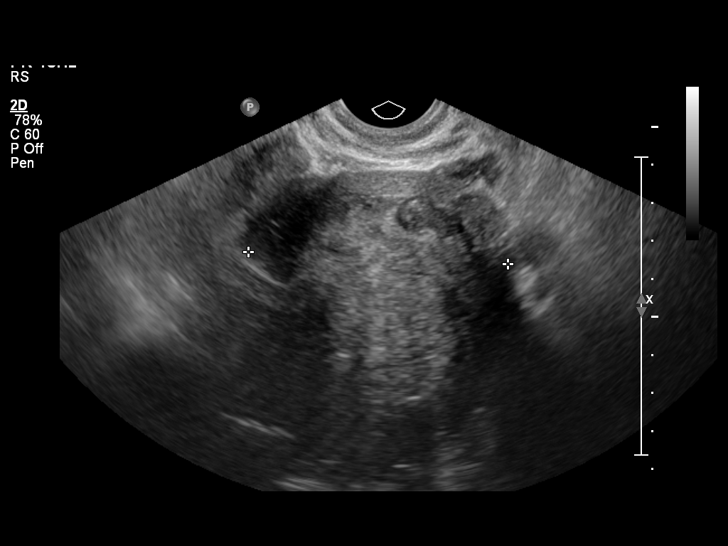
[im 51/56]
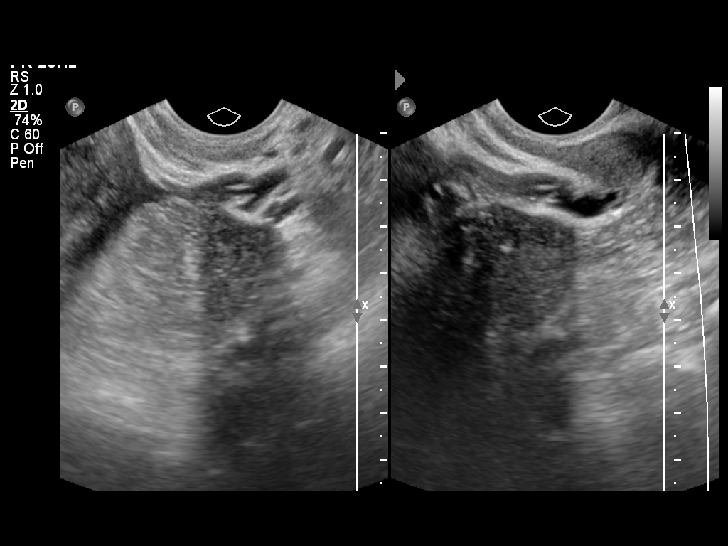
[im 56/56]
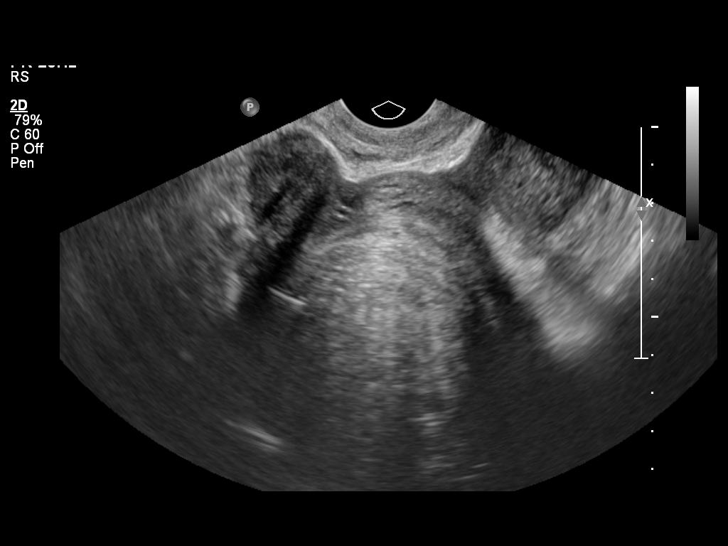

[13 of 25 positions shown; findings below may reference images not displayed]

FINDINGS: Uterus: Normal in size; measures 6.7 x 4.7 x 4.8 cm.  Three
fibroids are seen.  The first is noted obscuring the endometrial
canal, at the level of the fundus, measuring 4.1 x 3.5 x 3.4 cm.
There is a large exophytic fibroid measuring 8.5 x 7.0 x 6.8 cm
arising from the left side of the uterine fundus, and a 3.0 x
cm fibroid is seen arising at the anterior aspect of the uterine
fundus.

Endometrium: Not characterized.

Right ovary:  Normal appearance/no adnexal mass; measures 3.4 x
x 1.9 cm.

Left ovary: Normal appearance/no adnexal mass; measures 2.5 x 2.2 x
1.9 cm.

Other findings: A small amount of free fluid within the pelvic cul-
de-sac is likely physiologic in nature.
IMPRESSION: Fibroid uterus noted, with the largest fibroid measuring 8.5 cm in
size.  The endometrial echo complex is not characterized due to an
underlying fibroid.  No additional abnormalities seen.  The ovaries
appear grossly unremarkable, without evidence for ovarian torsion.

## 2014-11-22 ENCOUNTER — Emergency Department (HOSPITAL_COMMUNITY): Payer: Self-pay

## 2014-11-22 ENCOUNTER — Encounter (HOSPITAL_COMMUNITY): Payer: Self-pay | Admitting: Emergency Medicine

## 2014-11-22 ENCOUNTER — Emergency Department (HOSPITAL_COMMUNITY)
Admission: EM | Admit: 2014-11-22 | Discharge: 2014-11-22 | Disposition: A | Discharge status: Home / Self Care | Payer: Self-pay | Attending: Emergency Medicine | Admitting: Emergency Medicine

## 2014-11-22 DIAGNOSIS — S4992XA Unspecified injury of left shoulder and upper arm, initial encounter: Secondary | ICD-10-CM | POA: Insufficient documentation

## 2014-11-22 DIAGNOSIS — Y998 Other external cause status: Secondary | ICD-10-CM | POA: Insufficient documentation

## 2014-11-22 DIAGNOSIS — Z3202 Encounter for pregnancy test, result negative: Secondary | ICD-10-CM | POA: Insufficient documentation

## 2014-11-22 DIAGNOSIS — S299XXA Unspecified injury of thorax, initial encounter: Secondary | ICD-10-CM | POA: Insufficient documentation

## 2014-11-22 DIAGNOSIS — R109 Unspecified abdominal pain: Secondary | ICD-10-CM

## 2014-11-22 DIAGNOSIS — S24109A Unspecified injury at unspecified level of thoracic spinal cord, initial encounter: Secondary | ICD-10-CM | POA: Insufficient documentation

## 2014-11-22 DIAGNOSIS — Z8742 Personal history of other diseases of the female genital tract: Secondary | ICD-10-CM | POA: Insufficient documentation

## 2014-11-22 DIAGNOSIS — S3991XA Unspecified injury of abdomen, initial encounter: Secondary | ICD-10-CM | POA: Insufficient documentation

## 2014-11-22 DIAGNOSIS — M549 Dorsalgia, unspecified: Secondary | ICD-10-CM

## 2014-11-22 DIAGNOSIS — Y9289 Other specified places as the place of occurrence of the external cause: Secondary | ICD-10-CM | POA: Insufficient documentation

## 2014-11-22 DIAGNOSIS — Y9389 Activity, other specified: Secondary | ICD-10-CM | POA: Insufficient documentation

## 2014-11-22 LAB — I-STAT CHEM 8, ED
BUN: 16 mg/dL (ref 6–23)
CHLORIDE: 106 mmol/L (ref 96–112)
CREATININE: 0.6 mg/dL (ref 0.50–1.10)
Calcium, Ion: 1.2 mmol/L (ref 1.12–1.23)
Glucose, Bld: 86 mg/dL (ref 70–99)
HCT: 45 % (ref 36.0–46.0)
HEMOGLOBIN: 15.3 g/dL — AB (ref 12.0–15.0)
POTASSIUM: 4.3 mmol/L (ref 3.5–5.1)
SODIUM: 142 mmol/L (ref 135–145)
TCO2: 24 mmol/L (ref 0–100)

## 2014-11-22 LAB — I-STAT BETA HCG BLOOD, ED (MC, WL, AP ONLY)

## 2014-11-22 MED ORDER — LORAZEPAM 1 MG PO TABS
1.0000 mg | ORAL_TABLET | Freq: Once | ORAL | Status: AC
  Administered 2014-11-22: 1 mg via ORAL
  Filled 2014-11-22: qty 1

## 2014-11-22 MED ORDER — IOHEXOL 300 MG/ML  SOLN
80.0000 mL | Freq: Once | INTRAMUSCULAR | Status: AC | PRN
  Administered 2014-11-22: 80 mL via INTRAVENOUS

## 2014-11-22 MED ORDER — HYDROXYZINE HCL 10 MG PO TABS
10.0000 mg | ORAL_TABLET | Freq: Once | ORAL | Status: AC
  Administered 2014-11-22: 10 mg via ORAL
  Filled 2014-11-22 (×2): qty 1

## 2014-11-22 MED ORDER — KETOROLAC TROMETHAMINE 60 MG/2ML IM SOLN
60.0000 mg | Freq: Once | INTRAMUSCULAR | Status: AC
  Administered 2014-11-22: 60 mg via INTRAMUSCULAR
  Filled 2014-11-22: qty 2

## 2014-11-22 NOTE — ED Notes (Signed)
SANE Nurse will see pt. In approx 45 min.

## 2014-11-22 NOTE — ED Notes (Signed)
Patient transported to X-ray 

## 2014-11-22 NOTE — Discharge Instructions (Signed)
Return to the emergency room with worsening of symptoms, new symptoms or with symptoms that are concerning , especially fevers, abdominal pain in one area, unable to keep down fluids, blood in stool or vomit, severe pain, you feel faint, lightheaded or pass out. RICE: Rest, Ice (three cycles of 20 mins on, 89mins off at least twice a day), compression/brace, elevation. Heating pad works well for back pain. Ibuprofen 400mg  (2 tablets 200mg ) every 5-6 hours for 3-5 days. Follow up with family services. Please call your doctor for a followup appointment within 24-48 hours. When you talk to your doctor please let them know that you were seen in the emergency department and have them acquire all of your records so that they can discuss the findings with you and formulate a treatment plan to fully care for your new and ongoing problems. If you do not have a primary care provider please call the number below under ED resources to establish care with a provider and follow up.   Emergency Department Resource Guide 1) Find a Doctor and Pay Out of Pocket Although you won't have to find out who is covered by your insurance plan, it is a good idea to ask around and get recommendations. You will then need to call the office and see if the doctor you have chosen will accept you as a new patient and what types of options they offer for patients who are self-pay. Some doctors offer discounts or will set up payment plans for their patients who do not have insurance, but you will need to ask so you aren't surprised when you get to your appointment.  2) Contact Your Local Health Department Not all health departments have doctors that can see patients for sick visits, but many do, so it is worth a call to see if yours does. If you don't know where your local health department is, you can check in your phone book. The CDC also has a tool to help you locate your state's health department, and many state websites also have  listings of all of their local health departments.  3) Find a [facility] If your illness is not likely to be very severe or complicated, you may want to try a walk in clinic. These are popping up all over the country in pharmacies, drugstores, and shopping centers. They're usually staffed by nurse practitioners or physician assistants that have been trained to treat common illnesses and complaints. They're usually fairly quick and inexpensive. However, if you have serious medical issues or chronic medical problems, these are probably not your best option.  No Primary Care Doctor: - Call Health Connect at  [phone] - they can help you locate a primary care doctor that  accepts your insurance, provides certain services, etc. - Physician Referral Service- [phone]  Chronic Pain Problems: Organization         Address  Phone   Notes  [facility]  [phone] Patients need to be referred by their primary care doctor.   Medication Assistance: Organization         Address  Phone   Notes  [facility] [address] [place], [place] [address] [phone] --Must be a resident of [facility] -- Must have NO insurance coverage whatsoever (no Medicaid/ Medicare, etc.) -- The pt. MUST have a primary care doctor that directs their care regularly and follows them in the community   MedAssist  [phone]  [employer]  [phone]    Agencies that provide inexpensive medical care: Organization         Address  Phone   Notes  [place]  [phone]   [facility]    [phone]   [facility] [place], [place] [address] [phone]   [place] [address] [place] [phone]   Planned Parenthood    [phone]   [facility]    [phone]   [place] and [place]  [address], [place] Phone:  [phone], Fax:  [phone] Hours of Operation:  9 am - 6 pm, M-F.  Also accepts Medicaid/Medicare and self-pay.  [facility] for [address], [address], [place] Phone: [phone], Fax: [phone]. Hours of Operation:  8:30 am - 5:30 pm, M-F.  Also accepts Medicaid and self-pay.  [facility] [address], [place] Phone: [phone]   [place], [place], [place] [phone], Ext. [phone] Mondays & Thursdays: 7-9 AM.  First 15 patients are seen on a first come, first serve basis.    [place] Providers:  Organization         Address  Phone   Notes  [facility] [address], [place] [phone] Also accepts self-pay patients.  [facility], [place] [address], [place]  [phone]   [place], [address], [place] [phone]   [facility] [address] [place] [phone]   Lucianne Lei [address], [place]   [phone] Only accepts [place] Access [place] patients after they have their name applied to their card.   Self-Pay (no insurance) in [facility]:  Organization         Address  Phone   Notes  Sickle Cell Patients, [facility] [place] [phone]   [facility] [place] [phone]   [facility] [place]  [place], [place], [place] [phone]   [facility]/Dr. Osei-Bonsu  [address] [place] or [address], [place] [phone] Phone number for both [place] and [place] locations is the same.  Urgent Medical and [facility] [address] [place] [phone]   [facility] [address],  [place] or [address] [phone]   [facility] [address], [place] [phone], phone; [phone], fax Sees patients 1st and 3rd Saturday of every month.  Must not qualify for public or private insurance (i.e. Medicaid, Medicare, [place] Health Choice, Veterans' Benefits)  Household income should be no more than 200% of the poverty level The clinic cannot treat you if you are pregnant or think you are pregnant  Sexually transmitted diseases are not treated at the clinic.    Dental Care: Organization         Address  Phone  Notes  [facility] Department of [facility] [phone] Accepts children up to age 46 who are enrolled in [place] or [place]; pregnant women with a Medicaid card;  and children who have applied for Medicaid or [place] Health Choice, but were declined, whose parents can pay a reduced fee at time of service.  [facility] of [facility]  [address], [place] [phone] Accepts children up to age 30 who are enrolled in [place] or [place]; pregnant women with a Medicaid card; and children who have applied for Medicaid or [place] Health Choice, but were declined, whose parents can pay a reduced fee at time of service.  [place] Adult Dental Access PROGRAM  [place] [phone] Patients are seen by appointment only. Walk-ins are not accepted. [place] will see patients 38 years of age and older. Monday - Tuesday (8am-5pm) Most Wednesdays (8:30-5pm) $30 per visit, cash only  [facility] Adult Dental Access PROGRAM  [address], [facility] [phone] Patients are seen by appointment only. Walk-ins are not accepted. [place] will see patients 52 years of age and older. One Wednesday Evening (Monthly: Volunteer Based).  $30 per visit, cash only  [place]  [phone] for adults; Children under age 65, call Graduate Pediatric Dentistry at [phone]. Children aged 61-14, please call [phone] to request a pediatric application.  Dental services are provided in all areas of dental care including fillings, crowns and bridges, complete and partial dentures, implants, gum treatment, root canals, and extractions. Preventive care is also provided. Treatment is provided to both adults and children. Patients are selected via a lottery and there is often a waiting list.   [facility] [address] [place]  [phone] [email or web address]   [facility] [address] [place], [place] [phone], Ext. [phone] Second and Fourth Thursday of each month, opens at 6:30 AM; Clinic ends at 9 AM.  Patients are seen on a first-come first-served basis, and a limited number are seen during each clinic.   [facility]  [address] Hillard Danker [place], [place] [phone]   Eligibility Requirements You must have lived in [place], [place], or [place] counties for at least the last three months.   You cannot be eligible for state or federal sponsored [employer], including [employer], [place], or [employer].   You generally cannot be eligible for healthcare insurance through your employer.    How to apply: Eligibility screenings are held every Tuesday and Wednesday afternoon from 1:00 pm until 4:00 pm. You do not need an appointment for the interview!  [facility] [address] [place], [place]   [place]  [place] Department  [place]  [phone]    Behavioral Health Resources in the Community: Intensive Outpatient Programs Organization         Address  Phone  Notes  [place]. [address] [place], [place] [phone]     [facility] [address] [place], [place]   ADS: Alcohol & Drug Svcs [address] [place], [place]   [place] [address],  [place], [place] or [phone]   Substance Abuse Resources Organization         Address  Phone  Notes  Alcohol and Drug Services  [place]  [phone]   [facility] [place]   [facility]  [phone]  Residential & Outpatient Substance Abuse Program  [phone]   Psychological Services Organization         Address  Phone  Notes  [facility] [place]  [place]  [phone]   [place] [phone] N[address], [place] or [phone]    Mobile Crisis Teams Organization         Address  Phone  Notes  [facility], Mobile Crisis Care Unit  [phone]   [facility]ive Psychotherapeutic Services  [address] [place], [place]   [place] [address], [place] [phone]    Self-Help/Support Groups Organization         Address  Phone             Notes  [place] of [place] - variety of support groups  [place] Call for more information  Narcotics Anonymous (NA), [facility] [address], [employer] [place]  2 meetings at this location   Special educational needs teacher         Address  Phone  Notes  [facility]idential Treatment [place],    [place]  [phone]   [facility]  [address] [place] [phone], [place], [place]   [place], [place] [phone] Admissions: 8am-3pm M-F  Incentives Substance [place] [address] [place] [address]    [employer], [place] [phone]   [facility] [address] [place], [place], [place]   [facility] [address]  [place],  [place]   [facility] Programs - Intensive Outpatient [address], [place], [place]   [facility] ([place].) [place].,  [place], [place] [phone] or [phone]   [facility] (RTS) [address], [place], [place] Accepts Medicaid  [facility] [address],  [place] [phone] Substance Abuse/Addiction Treatment   [facility] Organization         Address  Phone  Notes  [facility]  [phone]   Domenic Schwab, PhD [address] [place], [place]   [phone] or [phone]   [place], [place] [phone]   [facility] [address], [place], [place] [phone] Insurance/Medicaid/sponsorship through [facility] and Families [address] [place]                                    [place], [place] [phone] [place] [address], [place] [phone]    Dr. Adele Schilder  [phone]   [facility] Dept. 1) [address] [place] 2) [place] 3)  [place] [address], [place] [phone]  [phone]   [place] [phone] or [phone] (After Hours)
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
## 2015-08-11 ENCOUNTER — Encounter (HOSPITAL_COMMUNITY): Payer: Self-pay | Admitting: Emergency Medicine

## 2015-08-11 ENCOUNTER — Emergency Department (HOSPITAL_COMMUNITY)
Admission: EM | Admit: 2015-08-11 | Discharge: 2015-08-11 | Disposition: A | Discharge status: Home / Self Care | Payer: Self-pay | Attending: Emergency Medicine | Admitting: Emergency Medicine

## 2015-08-11 DIAGNOSIS — H00026 Hordeolum internum left eye, unspecified eyelid: Secondary | ICD-10-CM

## 2015-08-11 DIAGNOSIS — Z8742 Personal history of other diseases of the female genital tract: Secondary | ICD-10-CM | POA: Insufficient documentation

## 2015-08-11 DIAGNOSIS — H00025 Hordeolum internum left lower eyelid: Secondary | ICD-10-CM | POA: Insufficient documentation

## 2015-08-11 MED ORDER — TETRACAINE HCL 0.5 % OP SOLN
1.0000 [drp] | Freq: Once | OPHTHALMIC | Status: AC
  Administered 2015-08-11: 1 [drp] via OPHTHALMIC
  Filled 2015-08-11: qty 2

## 2015-08-11 MED ORDER — SULFACETAMIDE SODIUM 10 % OP SOLN
2.0000 [drp] | OPHTHALMIC | Status: DC
  Administered 2015-08-11: 2 [drp] via OPHTHALMIC
  Filled 2015-08-11: qty 15

## 2015-08-11 MED ORDER — FLUORESCEIN SODIUM 1 MG OP STRP
1.0000 | ORAL_STRIP | Freq: Once | OPHTHALMIC | Status: AC
  Administered 2015-08-11: 1 via OPHTHALMIC
  Filled 2015-08-11: qty 1

## 2015-08-11 NOTE — ED Provider Notes (Signed)
CSN: [id number]     Arrival date & time 08/11/15  1747 History  By signing my name below, I, Meriel Pica, attest that this documentation has been prepared under the direction and in the presence of Debroah Baller, NP.  Electronically Signed: Meriel Pica, ED Scribe. 08/11/2015. 6:24 PM.   Chief Complaint  Patient presents with  . Eye Pain   The history is provided by the patient. No language interpreter was used.   HPI Comments: Brenda Barton is a 49 y.o. female, with no pertinent PMhx, who presents to the Emergency Department complaining of gradually worsening, constant, severe left eye pain with an injected conjunctiva X 7 days. Pt associates swelling surrounding left eye, yellow drainage from left eye, crusting to left eyelids, and blurry vision secondary to symptoms. She has not tried any alleviating treatments or medication. Denies prior similar symptoms, fevers, or chills. Pt denies wearing glasses or contacts.   Past Medical History  Diagnosis Date  . Endometriosis   . Ovarian cyst    Past Surgical History  Procedure Laterality Date  . No past surgeries     History reviewed. No pertinent family history. Social History  Substance Use Topics  . Smoking status: Never Smoker   . Smokeless tobacco: None  . Alcohol Use: Yes     Comment: occasional   OB History    No data available     Review of Systems  Constitutional: Negative for fever and chills.  Eyes: Positive for pain ( Left), discharge ( Left, yellow ), redness ( Left ) and visual disturbance ( L, blurred vision).  All other systems reviewed and are negative.  Allergies  Pear  Home Medications   Prior to Admission medications   Medication Sig Start Date End Date Taking? Authorizing Provider  ibuprofen (ADVIL,MOTRIN) 800 MG tablet Take 1 tablet (800 mg total) by mouth every 8 (eight) hours as needed. 01/16/14  Yes Charlann Lange, PA-C  acetaminophen (TYLENOL) 325 MG tablet Take 650 mg by mouth every 6 (six)  hours as needed for mild pain.    Historical Provider, MD   BP 153/94 mmHg  Pulse 64  Temp(Src) 98.5 F (36.9 C) (Oral)  Resp 20  SpO2 100% Physical Exam  Constitutional: She is oriented to person, place, and time. She appears well-developed and well-nourished. No distress.  HENT:  Head: Normocephalic.  Eyes: EOM are normal. Pupils are equal, round, and reactive to light. Left eye exhibits discharge. Left conjunctiva is injected. Right eye exhibits normal extraocular motion. Left eye exhibits normal extraocular motion.  Left eye; stye to lower lid of left eye with swelling to lower lid; sclera injected; normal EOM.  Neck: Normal range of motion. Neck supple.  Cardiovascular: Normal rate.   Pulmonary/Chest: Effort normal. No respiratory distress.  Musculoskeletal: Normal range of motion.  Neurological: She is alert and oriented to person, place, and time. Coordination normal.  Skin: Skin is warm.  Psychiatric: She has a normal mood and affect. Her behavior is normal.  Nursing note and vitals reviewed.   ED Course  Procedures  DIAGNOSTIC STUDIES: Oxygen Saturation is 100% on RA, normal by my interpretation.    COORDINATION OF CARE: 6:22 PM Discussed treatment plan which includes to perform eye exam using slit lamp with pt. Pt acknowledges and agrees to plan.  7:42 PM Pt provided with sodium Sulamyd   ophthalmic solution for use at home. She was also given referral to ophthalmology. Pt agreeable to plan and stable for discharge.  MDM  49 y.o. female with left eye redness and drainage and swelling of lower lid. Stable for d/c without facial erythema or signs of cellulitis. Will teat for sty and have patient follow up with opthalmology. She will return here as needed for problems.   Final diagnoses:  Hordeolum eyelid, internal, left    I personally performed the services described in this documentation, which was scribed in my presence. The recorded information has been reviewed  and is accurate.    Oral, NP 08/11/15 2114  Harvel Quale, MD 08/12/15 [phone]

## 2015-08-11 NOTE — ED Notes (Signed)
Pt presents to ED for evaluation of redness and swelling to the left eye.  Pt states she has had blurry vision and pain x 1 week

## 2015-08-11 NOTE — Discharge Instructions (Signed)
Stye A stye is a bump on your eyelid caused by a bacterial infection. A stye can form inside the eyelid (internal stye) or outside the eyelid (external stye). An internal stye may be caused by an infected oil-producing gland inside your eyelid. An external stye may be caused by an infection at the base of your eyelash (hair follicle). Styes are very common. Anyone can get them at any age. They usually occur in just one eye, but you may have more than one in either eye.  CAUSES  The infection is almost always caused by bacteria called Staphylococcus aureus. This is a common type of bacteria that lives on your skin. RISK FACTORS You may be at higher risk for a stye if you have had one before. You may also be at higher risk if you have:  Diabetes.  Long-term illness.  Long-term eye redness.  A skin condition called seborrhea.  High fat levels in your blood (lipids). SIGNS AND SYMPTOMS  Eyelid pain is the most common symptom of a stye. Internal styes are more painful than external styes. Other signs and symptoms may include:  Painful swelling of your eyelid.  A scratchy feeling in your eye.  Tearing and redness of your eye.  Pus draining from the stye. DIAGNOSIS  Your health care provider may be able to diagnose a stye just by examining your eye. The health care provider may also check to make sure:  You do not have a fever or other signs of a more serious infection.  The infection has not spread to other parts of your eye or areas around your eye. TREATMENT  Most styes will clear up in a few days without treatment. In some cases, you may need to use antibiotic drops or ointment to prevent infection. Your health care provider may have to drain the stye surgically if your stye is:  Large.  Causing a lot of pain.  Interfering with your vision. This can be done using a thin blade or a needle.  HOME CARE INSTRUCTIONS   Take medicines only as directed by your health care  provider.  Apply a clean, warm compress to your eye for 10 minutes, 4 times a day.  Do not wear contact lenses or eye makeup until your stye has healed.  Do not try to pop or drain the stye. SEEK MEDICAL CARE IF:  You have chills or a fever.  Your stye does not go away after several days.  Your stye affects your vision.  Your eyeball becomes swollen, red, or painful. MAKE SURE YOU:  Understand these instructions.  Will watch your condition.  Will get help right away if you are not doing well or get worse.   This information is not intended to replace advice given to you by your health care provider. Make sure you discuss any questions you have with your health care provider.   Document Released: 05/22/2005 Document Revised: 09/02/2014 Document Reviewed: 11/26/2013 Elsevier Interactive Patient Education 2016 [employer]  

## 2015-08-11 NOTE — ED Notes (Signed)
Pt able to ambulate independently 

## 2015-08-11 NOTE — ED Notes (Signed)
Pt continues to wait on MD eye assessment

## 2015-08-17 ENCOUNTER — Ambulatory Visit: Payer: Self-pay

## 2015-08-18 ENCOUNTER — Emergency Department (HOSPITAL_COMMUNITY): Payer: Self-pay

## 2015-08-18 ENCOUNTER — Emergency Department (HOSPITAL_COMMUNITY)
Admission: EM | Admit: 2015-08-18 | Discharge: 2015-08-18 | Disposition: A | Discharge status: Home / Self Care | Payer: Self-pay | Attending: Emergency Medicine | Admitting: Emergency Medicine

## 2015-08-18 ENCOUNTER — Encounter (HOSPITAL_COMMUNITY): Payer: Self-pay | Admitting: Emergency Medicine

## 2015-08-18 DIAGNOSIS — R102 Pelvic and perineal pain: Secondary | ICD-10-CM | POA: Insufficient documentation

## 2015-08-18 DIAGNOSIS — Z3202 Encounter for pregnancy test, result negative: Secondary | ICD-10-CM | POA: Insufficient documentation

## 2015-08-18 DIAGNOSIS — Z8742 Personal history of other diseases of the female genital tract: Secondary | ICD-10-CM | POA: Insufficient documentation

## 2015-08-18 DIAGNOSIS — Z86018 Personal history of other benign neoplasm: Secondary | ICD-10-CM | POA: Insufficient documentation

## 2015-08-18 LAB — URINALYSIS, ROUTINE W REFLEX MICROSCOPIC
Bilirubin Urine: NEGATIVE
Glucose, UA: NEGATIVE mg/dL
KETONES UR: NEGATIVE mg/dL
LEUKOCYTES UA: NEGATIVE
NITRITE: NEGATIVE
PROTEIN: NEGATIVE mg/dL
Specific Gravity, Urine: 1.021 (ref 1.005–1.030)
pH: 5.5 (ref 5.0–8.0)

## 2015-08-18 LAB — COMPREHENSIVE METABOLIC PANEL
ALT: 18 U/L (ref 14–54)
AST: 19 U/L (ref 15–41)
Albumin: 3.4 g/dL — ABNORMAL LOW (ref 3.5–5.0)
Alkaline Phosphatase: 58 U/L (ref 38–126)
Anion gap: 7 (ref 5–15)
BUN: 8 mg/dL (ref 6–20)
CHLORIDE: 108 mmol/L (ref 101–111)
CO2: 23 mmol/L (ref 22–32)
CREATININE: 0.66 mg/dL (ref 0.44–1.00)
Calcium: 9.1 mg/dL (ref 8.9–10.3)
GFR calc Af Amer: >60 mL/min (ref >60)
Glucose, Bld: 105 mg/dL — ABNORMAL HIGH (ref 65–99)
POTASSIUM: 3.8 mmol/L (ref 3.5–5.1)
Sodium: 138 mmol/L (ref 135–145)
TOTAL PROTEIN: 6.6 g/dL (ref 6.5–8.1)
Total Bilirubin: 0.4 mg/dL (ref 0.3–1.2)

## 2015-08-18 LAB — I-STAT BETA HCG BLOOD, ED (MC, WL, AP ONLY): I-stat hCG, quantitative: <5 m[IU]/mL (ref <5)

## 2015-08-18 LAB — CBC
HEMATOCRIT: 39.6 % (ref 36.0–46.0)
Hemoglobin: 13.4 g/dL (ref 12.0–15.0)
MCH: 30.5 pg (ref 26.0–34.0)
MCHC: 33.8 g/dL (ref 30.0–36.0)
MCV: 90.2 fL (ref 78.0–100.0)
PLATELETS: 188 10*3/uL (ref 150–400)
RBC: 4.39 MIL/uL (ref 3.87–5.11)
RDW: 14.6 % (ref 11.5–15.5)
WBC: 7.2 10*3/uL (ref 4.0–10.5)

## 2015-08-18 LAB — URINE MICROSCOPIC-ADD ON

## 2015-08-18 LAB — LIPASE, BLOOD: LIPASE: 43 U/L (ref 11–51)

## 2015-08-18 MED ORDER — OXYCODONE-ACETAMINOPHEN 5-325 MG PO TABS: 1.0000 | ORAL_TABLET | ORAL | Status: DC | PRN

## 2015-08-18 MED ORDER — OXYCODONE-ACETAMINOPHEN 5-325 MG PO TABS
2.0000 | ORAL_TABLET | Freq: Once | ORAL | Status: AC
Start: 2015-08-18 — End: 2015-08-18
  Administered 2015-08-18: 2 via ORAL
  Filled 2015-08-18: qty 2

## 2015-08-18 NOTE — ED Notes (Signed)
Patient transported to Ultrasound 

## 2015-08-18 NOTE — Discharge Instructions (Signed)
Take the prescribed medication as directed. May take motrin with this if needed. Follow-up with either your prior OB[facility]N or [facility] to have [facility] arranged.  Results attached on back. Return to the ED for new or worsening symptoms.

## 2015-08-18 NOTE — ED Notes (Signed)
Pt verbalized understanding of d/c instructions, prescriptions, and follow-up care. No further questions/concerns, VSS, ambulatory w/ steady gait (refused wheelchair).  Pt stating "I will take 3 percocets at one time to help with my pain." This RN explained that is not the instructions of the prescription - reminded pt to only take 1 tablet q4 hrs per prescription instructions. Pt verbalized understanding of d/c instructions.

## 2015-08-18 NOTE — ED Notes (Signed)
Pt ambulated to the bathroom with ease 

## 2015-08-18 NOTE — ED Provider Notes (Signed)
CSN: [id number]     Arrival date & time 08/18/15  0534 History   First MD Initiated Contact with Patient 08/18/15 [phone]     Chief Complaint  Patient presents with  . Abdominal Pain     (Consider location/radiation/quality/duration/timing/severity/associated sxs/prior Treatment) The history is provided by the patient and medical records.     49 y.o. F with hx of endometriosis, uterine fibroids, and ovarian cysts, presenting to the ED for recurrent pelvic pain.  Patient has been experiencing intermittent pelvic pain for the past few years.  She states she was actually pain free for approx 3 months at one point so she decided not to have hysterectomy as recommended by her OB-GYN.  States over the past 2 weeks pain has returned.  She reports pressure sensation across her lower abdomen, worse on left side.  States she feels some pressure in back at times as well.  Denies vaginal discharge or concern for STD, monogamous relationship with husband.  She denies nausea, vomiting, diarrhea.  No fever, chills, sweats.  She denies dysuria or hematuria but does not feel that she empties her bladder all the way when she goes.  Patient now no longer has insurance and cannot follow-up with her OB-GYN anymore.  She has been taking motrin at home without relief of pain.  Past Medical History  Diagnosis Date  . Endometriosis   . Ovarian cyst    Past Surgical History  Procedure Laterality Date  . No past surgeries     History reviewed. No pertinent family history. Social History  Substance Use Topics  . Smoking status: Never Smoker   . Smokeless tobacco: None  . Alcohol Use: Yes     Comment: occasional   OB History    No data available     Review of Systems  Genitourinary: Positive for pelvic pain.  All other systems reviewed and are negative.     Allergies  Pear  Home Medications   Prior to Admission medications   Medication Sig Start Date End Date Taking? Authorizing Provider   acetaminophen (TYLENOL) 325 MG tablet Take 650 mg by mouth every 6 (six) hours as needed for mild pain.    Historical Provider, MD  ibuprofen (ADVIL,MOTRIN) 800 MG tablet Take 1 tablet (800 mg total) by mouth every 8 (eight) hours as needed. 01/16/14   Shari Upstill, PA-C   BP 154/89 mmHg  Pulse 74  Temp(Src) 98.4 F (36.9 C) (Oral)  Resp 18  Ht 5\' 4"  (1.626 m)  Wt 74.844 kg  BMI 28.31 kg/m2  SpO2 100%   Physical Exam  Constitutional: She is oriented to person, place, and time. She appears well-developed and well-nourished. No distress.  HENT:  Head: Normocephalic and atraumatic.  Mouth/Throat: Oropharynx is clear and moist.  Eyes: Conjunctivae and EOM are normal. Pupils are equal, round, and reactive to light.  Neck: Normal range of motion.  Cardiovascular: Normal rate, regular rhythm and normal heart sounds.   Pulmonary/Chest: Effort normal and breath sounds normal. No respiratory distress. She has no wheezes.  Abdominal: Soft. Bowel sounds are normal. There is no tenderness. There is no rigidity, no guarding and no CVA tenderness.  Obese abdomen, overall non-tender  Musculoskeletal: Normal range of motion.  Neurological: She is alert and oriented to person, place, and time.  Skin: Skin is warm and dry. She is not diaphoretic.  Psychiatric: She has a normal mood and affect.  Nursing note and vitals reviewed.   ED Course  Procedures (including critical  care time) Labs Review Labs Reviewed  COMPREHENSIVE METABOLIC PANEL - Abnormal; Notable for the following:    Glucose, Bld 105 (*)    Albumin 3.4 (*)    All other components within normal limits  URINALYSIS, ROUTINE W REFLEX MICROSCOPIC (NOT AT [facility]) - Abnormal; Notable for the following:    Hgb urine dipstick MODERATE (*)    All other components within normal limits  URINE MICROSCOPIC-ADD ON - Abnormal; Notable for the following:    Squamous Epithelial / LPF 0-5 (*)    Bacteria, UA FEW (*)    All other components within  normal limits  LIPASE, BLOOD  CBC  I-STAT BETA HCG BLOOD, ED ([facility], [facility], [facility] ONLY)    Imaging Review US Transvaginal Non-ob  08/18/2015  CLINICAL DATA:  49 year old female with left pelvic pain for 2 weeks. Personal history of endometriosis, ovarian cysts. Reports LMP is unknown. Initial encounter. EXAM: TRANSABDOMINAL AND TRANSVAGINAL ULTRASOUND OF PELVIS DOPPLER ULTRASOUND OF OVARIES TECHNIQUE: Both transabdominal and transvaginal ultrasound examinations of the pelvis were performed. Transabdominal technique was performed for global imaging of the pelvis including uterus, ovaries, adnexal regions, and pelvic cul-de-sac. It was necessary to proceed with endovaginal exam following the transabdominal exam to visualize the ovaries. Color and duplex Doppler ultrasound was utilized to evaluate blood flow to the ovaries. COMPARISON:  CT Abdomen and Pelvis 11/22/2014. FINDINGS: Uterus Measurements: 13.7 x 9.1 x 9.6 cm. Large fundal fibroid measuring up to 9.6 cm diameter re- demonstrated. This is intramural versus submucosal, and on the comparison CT distorted the endometrial contour. Multiple smaller sub serosal fibroids are re- demonstrated. Measuring about 3.5 cm diameter. Endometrium Thickness: Not visualized. Right ovary Measurements: On transabdominal imaging there is a 5.5 cm rounded complex area in the right adnexal region (image 21), similar in appearance to the comparison CT. No normal right ovarian parenchyma could be identified. Left ovary Measurements: 3.4 x 3.3 x 3.1 cm. Small associated 2.2 cm hypoechoic cyst with reticular pattern of internal echoes typical of hemorrhagic physiologic cyst. Otherwise normal. Pulsed Doppler evaluation of the left ovary demonstrates normal low-resistance arterial and venous waveforms. Doppler of the right ovary could not be performed as no normal ovarian parenchyma could be identified. Other findings No abnormal free fluid. IMPRESSION: 1. Normal left ovary with no  evidence of torsion. No pelvic free fluid. 2. No normal right ovarian parenchyma could be identified, and a complex cystic and solid appearance of the right adnexa was seen on the March CT Abdomen and Pelvis. RECOMMEND followup Pelvis MRI (GYN protocol without and with contrast). Top differential considerations are solid right adnexal mass and endometrioma in this setting. 3. Fibroid uterus. Intramural versus submucosal 9.6 cm fibroid at the fundus plus smaller sub serosal fibroids. Electronically Signed   By: Genevie Ann M.D.   On: 08/18/2015 07:44   US Pelvis Complete  08/18/2015  CLINICAL DATA:  49 year old female with left pelvic pain for 2 weeks. Personal history of endometriosis, ovarian cysts. Reports LMP is unknown. Initial encounter. EXAM: TRANSABDOMINAL AND TRANSVAGINAL ULTRASOUND OF PELVIS DOPPLER ULTRASOUND OF OVARIES TECHNIQUE: Both transabdominal and transvaginal ultrasound examinations of the pelvis were performed. Transabdominal technique was performed for global imaging of the pelvis including uterus, ovaries, adnexal regions, and pelvic cul-de-sac. It was necessary to proceed with endovaginal exam following the transabdominal exam to visualize the ovaries. Color and duplex Doppler ultrasound was utilized to evaluate blood flow to the ovaries. COMPARISON:  CT Abdomen and Pelvis 11/22/2014. FINDINGS: Uterus Measurements: 13.7 x 9.1  x 9.6 cm. Large fundal fibroid measuring up to 9.6 cm diameter re- demonstrated. This is intramural versus submucosal, and on the comparison CT distorted the endometrial contour. Multiple smaller sub serosal fibroids are re- demonstrated. Measuring about 3.5 cm diameter. Endometrium Thickness: Not visualized. Right ovary Measurements: On transabdominal imaging there is a 5.5 cm rounded complex area in the right adnexal region (image 21), similar in appearance to the comparison CT. No normal right ovarian parenchyma could be identified. Left ovary Measurements: 3.4 x 3.3 x  3.1 cm. Small associated 2.2 cm hypoechoic cyst with reticular pattern of internal echoes typical of hemorrhagic physiologic cyst. Otherwise normal. Pulsed Doppler evaluation of the left ovary demonstrates normal low-resistance arterial and venous waveforms. Doppler of the right ovary could not be performed as no normal ovarian parenchyma could be identified. Other findings No abnormal free fluid. IMPRESSION: 1. Normal left ovary with no evidence of torsion. No pelvic free fluid. 2. No normal right ovarian parenchyma could be identified, and a complex cystic and solid appearance of the right adnexa was seen on the March CT Abdomen and Pelvis. RECOMMEND followup Pelvis MRI (GYN protocol without and with contrast). Top differential considerations are solid right adnexal mass and endometrioma in this setting. 3. Fibroid uterus. Intramural versus submucosal 9.6 cm fibroid at the fundus plus smaller sub serosal fibroids. Electronically Signed   By: Genevie Ann M.D.   On: 08/18/2015 07:44   [place] Art/ven Flow Abd Pelv Doppler  08/18/2015  CLINICAL DATA:  49 year old female with left pelvic pain for 2 weeks. Personal history of endometriosis, ovarian cysts. Reports LMP is unknown. Initial encounter. EXAM: TRANSABDOMINAL AND TRANSVAGINAL ULTRASOUND OF PELVIS DOPPLER ULTRASOUND OF OVARIES TECHNIQUE: Both transabdominal and transvaginal ultrasound examinations of the pelvis were performed. Transabdominal technique was performed for global imaging of the pelvis including uterus, ovaries, adnexal regions, and pelvic cul-de-sac. It was necessary to proceed with endovaginal exam following the transabdominal exam to visualize the ovaries. Color and duplex Doppler ultrasound was utilized to evaluate blood flow to the ovaries. COMPARISON:  CT Abdomen and Pelvis 11/22/2014. FINDINGS: Uterus Measurements: 13.7 x 9.1 x 9.6 cm. Large fundal fibroid measuring up to 9.6 cm diameter re- demonstrated. This is intramural versus submucosal, and  on the comparison CT distorted the endometrial contour. Multiple smaller sub serosal fibroids are re- demonstrated. Measuring about 3.5 cm diameter. Endometrium Thickness: Not visualized. Right ovary Measurements: On transabdominal imaging there is a 5.5 cm rounded complex area in the right adnexal region (image 21), similar in appearance to the comparison CT. No normal right ovarian parenchyma could be identified. Left ovary Measurements: 3.4 x 3.3 x 3.1 cm. Small associated 2.2 cm hypoechoic cyst with reticular pattern of internal echoes typical of hemorrhagic physiologic cyst. Otherwise normal. Pulsed Doppler evaluation of the left ovary demonstrates normal low-resistance arterial and venous waveforms. Doppler of the right ovary could not be performed as no normal ovarian parenchyma could be identified. Other findings No abnormal free fluid. IMPRESSION: 1. Normal left ovary with no evidence of torsion. No pelvic free fluid. 2. No normal right ovarian parenchyma could be identified, and a complex cystic and solid appearance of the right adnexa was seen on the March CT Abdomen and Pelvis. RECOMMEND followup Pelvis MRI (GYN protocol without and with contrast). Top differential considerations are solid right adnexal mass and endometrioma in this setting. 3. Fibroid uterus. Intramural versus submucosal 9.6 cm fibroid at the fundus plus smaller sub serosal fibroids. Electronically Signed   By: Lemmie Evens  [place] Crane M.D.   On: 08/18/2015 07:44   I have personally reviewed and evaluated these images and lab results as part of my medical decision-making.   EKG Interpretation None      MDM   Final diagnoses:  Pelvic pain in female   49 y.o. F here with recurrent left pelvic pain-- longstanding hx of same due to uterine fibroids and ovarian cysts.  Patient if afebrile, non-toxic in appearance.  Abdomen is obese but non-tender on exam.  She has no peritoneal signs.  Denies vaginal discharge or concern for STD.  Labs pending.   Will obtain pelvic ultrasound.  Percocet given for pain.  Lab work is reassuring.  Ultrasound with complex cystic and solid appearance of right adnexa, similar to appearance on CT in March 2016-- questionable mass vs endometrioma.  Discussed results with patient, she does not recall being informed of this before.  Results are somewhat abnormal as findings are on the right, however her pain is on the left.  Nonetheless, will have patient either follow-up with her former OB[facility]N or [facility]-- given copies of results for physician review.  Short supply of percocet given for pain.  Discussed plan with patient, he/she acknowledged understanding and agreed with plan of care.  Return precautions given for new or worsening symptoms.  Larene Pickett, PA-C 08/18/15 [id number]  Forde Dandy, MD 08/19/15 1754

## 2015-08-18 NOTE — ED Notes (Signed)
Pt arrives by POV with c/o recurrent abdominal pain. Pt states she has ovarian cyst and she is worried it has gotten too large. States she feels like she is not emptying her bladder when she urinates. Stated that she is out of her pain medication and does not have insurance to go to regular doctor.

## 2016-07-15 ENCOUNTER — Encounter (HOSPITAL_COMMUNITY): Payer: Self-pay | Admitting: Emergency Medicine

## 2016-07-15 ENCOUNTER — Ambulatory Visit (HOSPITAL_COMMUNITY)
Admission: EM | Admit: 2016-07-15 | Discharge: 2016-07-15 | Disposition: A | Discharge status: Home / Self Care | Payer: BLUE CROSS/BLUE SHIELD | Attending: Emergency Medicine | Admitting: Emergency Medicine

## 2016-07-15 DIAGNOSIS — H00014 Hordeolum externum left upper eyelid: Secondary | ICD-10-CM

## 2016-07-15 DIAGNOSIS — H65112 Acute and subacute allergic otitis media (mucoid) (sanguinous) (serous), left ear: Secondary | ICD-10-CM | POA: Diagnosis not present

## 2016-07-15 MED ORDER — TOBRAMYCIN 0.3 % OP OINT: 1.0000 "application " | TOPICAL_OINTMENT | Freq: Three times a day (TID) | OPHTHALMIC | 0 refills | Status: DC

## 2016-07-15 MED ORDER — AMOXICILLIN 500 MG PO CAPS: 500.0000 mg | ORAL_CAPSULE | Freq: Three times a day (TID) | ORAL | 0 refills | Status: DC

## 2016-07-15 NOTE — ED Provider Notes (Signed)
CSN: [id number]     Arrival date & time 07/15/16  1533 History   None    Chief Complaint  Patient presents with  . Otalgia   (Consider location/radiation/quality/duration/timing/severity/associated sxs/prior Treatment) Pt also complains of a stye to left eye.    The history is provided by the patient. No language interpreter was used.  Otalgia  Location:  Bilateral Behind ear:  No abnormality Quality:  Aching Severity:  Moderate Onset quality:  Gradual Duration:  1 day Timing:  Constant Progression:  Worsening Chronicity:  New Relieved by:  Nothing Worsened by:  Nothing Ineffective treatments:  None tried Associated symptoms: no fever     Past Medical History:  Diagnosis Date  . Endometriosis   . Ovarian cyst    Past Surgical History:  Procedure Laterality Date  . NO PAST SURGERIES     History reviewed. No pertinent family history. Social History  Substance Use Topics  . Smoking status: Never Smoker  . Smokeless tobacco: Never Used  . Alcohol use Yes     Comment: occasional   OB History    No data available     Review of Systems  Constitutional: Negative for fever.  HENT: Positive for ear pain.   All other systems reviewed and are negative.   Allergies  Pear  Home Medications   Prior to Admission medications   Medication Sig Start Date End Date Taking? Authorizing Provider  acetaminophen (TYLENOL) 325 MG tablet Take 650 mg by mouth every 6 (six) hours as needed for mild pain.    Historical Provider, MD  amoxicillin (AMOXIL) 500 MG capsule Take 1 capsule (500 mg total) by mouth 3 (three) times daily. 07/15/16   Fransico Meadow, PA-C  oxyCODONE-acetaminophen (PERCOCET/ROXICET) 5-325 MG tablet Take 1 tablet by mouth every 4 (four) hours as needed. 08/18/15   Larene Pickett, PA-C  tobramycin (TOBREX) 0.3 % ophthalmic ointment Place 1 application into the left eye 3 (three) times daily. 07/15/16   Fransico Meadow, PA-C   Meds Ordered and Administered this  Visit  Medications - No data to display  BP 138/78 (BP Location: Right Arm)   Pulse 69   Temp 98.4 F (36.9 C) (Oral)   Resp 18   SpO2 98%  No data found.   Physical Exam  Constitutional: She appears well-developed and well-nourished.  HENT:  Head: Normocephalic and atraumatic.  Erythema bilat tm's    Eyes: EOM are normal. Pupils are equal, round, and reactive to light.  Left eyelid 2 mm swollen area top of eyelid.    Neck: Normal range of motion.  Cardiovascular: Normal rate and regular rhythm.   Pulmonary/Chest: Effort normal.  Musculoskeletal: Normal range of motion.  Neurological: She is alert.  Skin: Skin is warm.  Psychiatric: She has a normal mood and affect.  Nursing note and vitals reviewed.   Urgent Care Course   Clinical Course     Procedures (including critical care time)  Labs Review Labs Reviewed - No data to display  Imaging Review No results found.   Visual Acuity Review  Right Eye Distance:   Left Eye Distance:   Bilateral Distance:    Right Eye Near:   Left Eye Near:    Bilateral Near:         MDM   1. Acute mucoid otitis media of left ear   2. Hordeolum externum of left upper eyelid    An After Visit Summary was printed and given to the patient. Meds  ordered this encounter  Medications  . tobramycin (TOBREX) 0.3 % ophthalmic ointment    Sig: Place 1 application into the left eye 3 (three) times daily.    Dispense:  3.5 g    Refill:  0    Order Specific Question:   Supervising Provider    Answer:   Melynda Ripple [[id number]]  . amoxicillin (AMOXIL) 500 MG capsule    Sig: Take 1 capsule (500 mg total) by mouth 3 (three) times daily.    Dispense:  30 capsule    Refill:  0    Order Specific Question:   Supervising Provider    Answer:   Melynda Ripple [place]     Fransico Meadow, PA-C 07/15/16 2115

## 2016-07-15 NOTE — ED Triage Notes (Addendum)
The patient presented to the [facility] with a complaint of bilateral ear pain and dizziness that started last night. The patient reported that the dizziness is positional.  The patient also reported a stye on her left eye.

## 2016-07-15 NOTE — Discharge Instructions (Signed)
Schedule to see an Opthalmologist  if symptoms persist

## 2017-03-07 ENCOUNTER — Emergency Department (HOSPITAL_COMMUNITY)
Admission: EM | Admit: 2017-03-07 | Discharge: 2017-03-07 | Discharge status: Against Medical Advice | Payer: BLUE CROSS/BLUE SHIELD

## 2017-03-08 ENCOUNTER — Encounter (HOSPITAL_COMMUNITY): Payer: Self-pay | Admitting: *Deleted

## 2017-03-08 ENCOUNTER — Ambulatory Visit (HOSPITAL_COMMUNITY)
Admission: EM | Admit: 2017-03-08 | Discharge: 2017-03-08 | Disposition: A | Discharge status: Home / Self Care | Payer: 59 | Attending: Internal Medicine | Admitting: Internal Medicine

## 2017-03-08 DIAGNOSIS — R5383 Other fatigue: Secondary | ICD-10-CM

## 2017-03-08 DIAGNOSIS — R5381 Other malaise: Secondary | ICD-10-CM | POA: Diagnosis not present

## 2017-03-08 DIAGNOSIS — J020 Streptococcal pharyngitis: Secondary | ICD-10-CM | POA: Diagnosis not present

## 2017-03-08 LAB — POCT RAPID STREP A: Streptococcus, Group A Screen (Direct): POSITIVE — AB

## 2017-03-08 MED ORDER — AMOXICILLIN 400 MG/5ML PO SUSR: 1000.0000 mg | Freq: Two times a day (BID) | ORAL | 0 refills | Status: AC

## 2017-03-08 MED ORDER — AMOXICILLIN 500 MG PO CAPS: 1000.0000 mg | ORAL_CAPSULE | Freq: Two times a day (BID) | ORAL | 0 refills | Status: AC

## 2017-03-08 NOTE — ED Triage Notes (Signed)
C/O quick onset sore throat, bilat ear ache, nausea x 3 days.  Denies fevers.  C/O severe throat pain causing inability to swallow fluids well.

## 2017-03-08 NOTE — ED Provider Notes (Signed)
CSN: [id number]     Arrival date & time 03/08/17  1204 History   First MD Initiated Contact with Patient 03/08/17 1254     Chief Complaint  Patient presents with  . Sore Throat  . Nausea   (Consider location/radiation/quality/duration/timing/severity/associated sxs/prior Treatment) 51 year old female complaining of sore throat, odynophagia, earache, shortness of breath and vomiting this morning. She has a decrease in appetite. Denies fever. Has taken NyQuil and echinacea and arched use with pineapple.      Past Medical History:  Diagnosis Date  . Endometriosis   . Ovarian cyst    Past Surgical History:  Procedure Laterality Date  . NO PAST SURGERIES     No family history on file. Social History  Substance Use Topics  . Smoking status: Never Smoker  . Smokeless tobacco: Never Used  . Alcohol use Yes     Comment: occasional   OB History    No data available     Review of Systems  Constitutional: Positive for activity change, appetite change and fatigue. Negative for fever.  HENT: Positive for ear pain, postnasal drip and sore throat.   Respiratory: Negative.   Cardiovascular: Positive for chest pain.  Gastrointestinal: Positive for vomiting.  Genitourinary: Negative.   Neurological: Negative.   All other systems reviewed and are negative.   Allergies  Bee venom and Pear  Home Medications   Prior to Admission medications   Medication Sig Start Date End Date Taking? Authorizing Provider  acetaminophen (TYLENOL) 325 MG tablet Take 650 mg by mouth every 6 (six) hours as needed for mild pain.    [provider]  amoxicillin (AMOXIL) 500 MG capsule Take 2 capsules (1,000 mg total) by mouth 2 (two) times daily. 03/08/17   Janne Napoleon, NP   Meds Ordered and Administered this Visit  Medications - No data to display  BP 130/79   Pulse 80   Temp 98.3 F (36.8 C) (Oral)   Resp 16   LMP 09/23/2014 (Approximate)   SpO2 99%  No data found.   Physical Exam   Constitutional: She is oriented to person, place, and time. She appears well-developed and well-nourished. No distress.  HENT:  Head: Normocephalic and atraumatic.  Bilateral TMs are mildly retracted otherwise normal. Oropharynx with cobblestoning and minor erythema. Airway is widely patent. No swelling or exudate.  Eyes: EOM are normal.  Neck: Normal range of motion. Neck supple.  Cardiovascular: Normal rate, regular rhythm and normal heart sounds.   Pulmonary/Chest: Effort normal and breath sounds normal. No respiratory distress. She has no wheezes.  Musculoskeletal: Normal range of motion. She exhibits no edema.  Lymphadenopathy:    She has no cervical adenopathy.  Neurological: She is alert and oriented to person, place, and time.  Skin: Skin is warm and dry. No rash noted.  Psychiatric: She has a normal mood and affect.  Nursing note and vitals reviewed.   Urgent Care Course     Procedures (including critical care time)  Labs Review Labs Reviewed  POCT RAPID STREP A - Abnormal; Notable for the following:       Result Value   Streptococcus, Group A Screen (Direct) POSITIVE (*)    All other components within normal limits    Imaging Review No results found.   Visual Acuity Review  Right Eye Distance:   Left Eye Distance:   Bilateral Distance:    Right Eye Near:   Left Eye Near:    Bilateral Near:  MDM   1. Strep pharyngitis   2. Streptococcal sore throat   3. Malaise and fatigue    Drink plenty of cool liquids. If you are vomiting then hold off on the solid foods for a couple of days. Cepacol lozenges for sore throat pain. Ibuprofen 600 mg every 6 hours as needed for sore throat pain. Take the amoxicillin as directed. should be feeling much better within 48 hours. You also must be on antibiotics for a full 24 hours before going back to work. Meds ordered this encounter  Medications  . amoxicillin (AMOXIL) 500 MG capsule    Sig: Take 2 capsules  (1,000 mg total) by mouth 2 (two) times daily.    Dispense:  40 capsule    Refill:  0    Order Specific Question:   Supervising Provider    Answer:   Sherlene Shams [[id number]]       Janne Napoleon, NP 03/08/17 1341

## 2017-03-08 NOTE — Discharge Instructions (Signed)
Drink plenty of cool liquids. If you are vomiting then hold off on the solid foods for a couple of days. Cepacol lozenges for sore throat pain. Ibuprofen 600 mg every 6 hours as needed for sore throat pain. Take the amoxicillin as directed. should be feeling much better within 48 hours. You also must be on antibiotics for a full 24 hours before going back to work.

## 2017-11-20 ENCOUNTER — Ambulatory Visit (HOSPITAL_COMMUNITY)
Admission: EM | Admit: 2017-11-20 | Discharge: 2017-11-20 | Disposition: A | Discharge status: Home / Self Care | Payer: 59 | Attending: Internal Medicine | Admitting: Internal Medicine

## 2017-11-20 ENCOUNTER — Ambulatory Visit (INDEPENDENT_AMBULATORY_CARE_PROVIDER_SITE_OTHER): Payer: 59

## 2017-11-20 ENCOUNTER — Encounter (HOSPITAL_COMMUNITY): Payer: Self-pay | Admitting: Emergency Medicine

## 2017-11-20 DIAGNOSIS — B9789 Other viral agents as the cause of diseases classified elsewhere: Secondary | ICD-10-CM | POA: Diagnosis not present

## 2017-11-20 DIAGNOSIS — J069 Acute upper respiratory infection, unspecified: Secondary | ICD-10-CM

## 2017-11-20 MED ORDER — GUAIFENESIN ER 600 MG PO TB12: 1200.0000 mg | ORAL_TABLET | Freq: Two times a day (BID) | ORAL | 0 refills | Status: AC

## 2017-11-20 MED ORDER — MEDICAL COMPRESSION STOCKINGS MISC: 2.0000 [IU] | Freq: Once | 0 refills | Status: AC

## 2017-11-20 MED ORDER — ALBUTEROL SULFATE HFA 108 (90 BASE) MCG/ACT IN AERS
INHALATION_SPRAY | RESPIRATORY_TRACT | Status: AC
  Filled 2017-11-20: qty 6.7

## 2017-11-20 MED ORDER — ALBUTEROL SULFATE HFA 108 (90 BASE) MCG/ACT IN AERS
2.0000 | INHALATION_SPRAY | Freq: Once | RESPIRATORY_TRACT | Status: AC
  Administered 2017-11-20: 2 via RESPIRATORY_TRACT

## 2017-11-20 NOTE — ED Triage Notes (Signed)
Pt c/o cough, chest congestion, body aches, chills. Pt requesting chest xray.

## 2017-11-20 NOTE — ED Provider Notes (Signed)
Brenda Barton    CSN: [id number] Arrival date & time: 11/20/17  1825     History   Chief Complaint Chief Complaint  Patient presents with  . URI    HPI Brenda Barton is a 52 y.o. female.   Brenda Barton presents with complaints of chest tightness, cough, congestion, body aches and chills which started yesterday. Denies gi/gu complaints. Cough is productive. Sore throat. Without ear pain. Denies  Fevers. Denies gi/gu complaints. States she had been on vacation and had taken her mother to an ER and is concerned she contracted illness there. Has taken mucinex and alkaselzer plus which helped. Without contributing medical history.   ROS per HPI.      Past Medical History:  Diagnosis Date  . Endometriosis   . Ovarian cyst     There are no active problems to display for this patient.   Past Surgical History:  Procedure Laterality Date  . NO PAST SURGERIES      OB History   None      Home Medications    Prior to Admission medications   Medication Sig Start Date End Date Taking? Authorizing Provider  acetaminophen (TYLENOL) 325 MG tablet Take 650 mg by mouth every 6 (six) hours as needed for mild pain.    [provider]  amoxicillin (AMOXIL) 400 MG/5ML suspension Take 12.5 mLs (1,000 mg total) by mouth 2 (two) times daily. x10 days  Cancel capsule Rx Patient not taking: Reported on 11/20/2017 03/08/17   Janne Napoleon, NP  amoxicillin (AMOXIL) 500 MG capsule Take 2 capsules (1,000 mg total) by mouth 2 (two) times daily. Patient not taking: Reported on 11/20/2017 03/08/17   Janne Napoleon, NP  guaiFENesin (MUCINEX) 600 MG 12 hr tablet Take 2 tablets (1,200 mg total) by mouth 2 (two) times daily for 5 days. 11/20/17 11/25/17  Zigmund Gottron, NP    Family History No family history on file.  Social History Social History   Tobacco Use  . Smoking status: Never Smoker  . Smokeless tobacco: Never Used  Substance Use Topics  . Alcohol use: Yes    Comment:  occasional  . Drug use: No     Allergies   Bee venom and Pear   Review of Systems Review of Systems   Physical Exam Triage Vital Signs ED Triage Vitals [11/20/17 1905]  Enc Vitals Group     BP (!) 149/89     Pulse Rate 73     Resp 18     Temp 98.6 F (37 C)     Temp src      SpO2 100 %     Weight      Height      Head Circumference      Peak Flow      Pain Score      Pain Loc      Pain Edu?      Excl. in [place]?    No data found.  Updated Vital Signs BP (!) 149/89   Pulse 73   Temp 98.6 F (37 C)   Resp 18   LMP 09/23/2014 (Approximate)   SpO2 100%   Visual Acuity Right Eye Distance:   Left Eye Distance:   Bilateral Distance:    Right Eye Near:   Left Eye Near:    Bilateral Near:     Physical Exam  Constitutional: She is oriented to person, place, and time. She appears well-developed and well-nourished. No distress.  HENT:  Head: Normocephalic and atraumatic.  Right Ear: Tympanic membrane, external ear and ear canal normal.  Left Ear: Tympanic membrane, external ear and ear canal normal.  Nose: Nose normal.  Mouth/Throat: Uvula is midline and mucous membranes are normal. Posterior oropharyngeal erythema present. No tonsillar exudate.  Eyes: Pupils are equal, round, and reactive to light. Conjunctivae and EOM are normal.  Cardiovascular: Normal rate, regular rhythm and normal heart sounds.  Pulmonary/Chest: Effort normal and breath sounds normal.  Lymphadenopathy:    She has no cervical adenopathy.  Neurological: She is alert and oriented to person, place, and time.  Skin: Skin is warm and dry.     UC Treatments / Results  Labs (all labs ordered are listed, but only abnormal results are displayed) Labs Reviewed - No data to display  EKG None Radiology Dg Chest 2 View  Result Date: 11/20/2017 CLINICAL DATA:  Cough and congestion EXAM: CHEST - 2 VIEW COMPARISON:  November 22, 2014 FINDINGS: There is no edema or consolidation. Heart size and  pulmonary vascularity are normal. No adenopathy. There is an old healed fracture of the left clavicle. IMPRESSION: No edema or consolidation. Electronically Signed   By: Lowella Grip III M.D.   On: 11/20/2017 19:55    Procedures Procedures (including critical care time)  Medications Ordered in UC Medications  albuterol (PROVENTIL HFA;VENTOLIN HFA) 108 (90 Base) MCG/ACT inhaler 2 puff (has no administration in time range)     Initial Impression / Assessment and Plan / UC Course  I have reviewed the triage vital signs and the nursing notes.  Pertinent labs & imaging results that were available during my care of the patient were reviewed by me and considered in my medical decision making (see chart for details).     Benign physical findings. Non toxic in appearance. Afebrile. Without hypoxia, tachypnea, tachycardia. Lungs clear. Chest xray without acute findings. Albuterol as needed for chest tightness. Continue with mucinex as expectorant. History and physical consistent with viral illness. Supportive cares recommended. Return precautions provided. Patient verbalized understanding and agreeable to plan.    Final Clinical Impressions(s) / UC Diagnoses   Final diagnoses:  Viral URI with cough    ED Discharge Orders        Ordered    guaiFENesin (MUCINEX) 600 MG 12 hr tablet  2 times daily     11/20/17 2021       Controlled Substance Prescriptions [place] Controlled Substance Registry consulted? Not Applicable   Zigmund Gottron, NP 11/20/17 2029

## 2017-11-20 NOTE — Discharge Instructions (Signed)
Push fluids to ensure adequate hydration and keep secretions thin. . Tylenol and/or ibuprofen as needed for pain or fevers.  Mucinex twice a day as an expectorant. Albuterol as needed for chest tightness. If symptoms worsen or do not improve in the next week to return to be seen or to follow up with your PCP.

## 2017-11-23 ENCOUNTER — Telehealth (HOSPITAL_COMMUNITY): Payer: Self-pay | Admitting: Emergency Medicine

## 2017-11-23 NOTE — Telephone Encounter (Signed)
Patient requesting a work note.  Agreed to work note, but when explained that the 3/28 would be day 1, 3/29 is day #2, and 3/30 is day #3.  Return date to work is 3/31.   Encouraged patient to return for further evaluation.  Told our policy is a 3 day maximum.  Patient not pleased

## 2017-11-24 ENCOUNTER — Telehealth (HOSPITAL_COMMUNITY): Payer: Self-pay | Admitting: Emergency Medicine

## 2017-11-24 NOTE — Telephone Encounter (Signed)
Late entry 13:15.  Patient access reported patient in department to get work note and was demanding 3/31 be excused as well.    This nurse spoke to patient on the phone yesterday (3/31).  Explained how the note would read based on policy.    Spoke to [employer], NP that initially treated patient.  reviewed documentation of assessment.  Discussed understanding of departments practice when writing school/work notes.  Lorne Skeens, NP in agreement with how work note prepared.    This nurse spoke with patient in the lobby.  Patient continued to ask why we gave a work note for a day she had off.  Explained work notes not written based on work schedule, but when seen and illness severity.  Patient is concerned for losing her job based on points received.    (cindy-patient access) Provided patient with director"s phone number for further assistance.

## 2019-09-09 ENCOUNTER — Telehealth: Payer: Self-pay | Admitting: Nurse Practitioner

## 2019-09-09 NOTE — Telephone Encounter (Signed)
Called to Discuss with patient about Covid symptoms and the use of bamlanivimab, a monoclonal antibody infusion for those with mild to moderate Covid symptoms and at a high risk of hospitalization.     Patient does not meet criteria.
